# Patient Record
Sex: Male | Born: 1985
Health system: Southern US, Community
[De-identification: ages and names within clinical notes are randomized; demographics above are authoritative.]

## PROBLEM LIST (undated history)

## (undated) DIAGNOSIS — K5190 Ulcerative colitis, unspecified, without complications: Secondary | ICD-10-CM

## (undated) DIAGNOSIS — Z21 Asymptomatic human immunodeficiency virus [HIV] infection status: Secondary | ICD-10-CM

---

## 2015-12-26 ENCOUNTER — Emergency Department (HOSPITAL_BASED_OUTPATIENT_CLINIC_OR_DEPARTMENT_OTHER)
Admission: EM | Admit: 2015-12-26 | Discharge: 2015-12-26 | Disposition: A | Discharge status: Home / Self Care | Payer: Medicare Other | Attending: Emergency Medicine | Admitting: Emergency Medicine

## 2015-12-26 ENCOUNTER — Encounter (HOSPITAL_BASED_OUTPATIENT_CLINIC_OR_DEPARTMENT_OTHER): Payer: Self-pay

## 2015-12-26 DIAGNOSIS — Z202 Contact with and (suspected) exposure to infections with a predominantly sexual mode of transmission: Secondary | ICD-10-CM | POA: Diagnosis not present

## 2015-12-26 DIAGNOSIS — F17210 Nicotine dependence, cigarettes, uncomplicated: Secondary | ICD-10-CM | POA: Insufficient documentation

## 2015-12-26 DIAGNOSIS — Z21 Asymptomatic human immunodeficiency virus [HIV] infection status: Secondary | ICD-10-CM | POA: Insufficient documentation

## 2015-12-26 DIAGNOSIS — Z79899 Other long term (current) drug therapy: Secondary | ICD-10-CM | POA: Diagnosis not present

## 2015-12-26 HISTORY — DX: Ulcerative colitis, unspecified, without complications: K51.90

## 2015-12-26 HISTORY — DX: Asymptomatic human immunodeficiency virus (hiv) infection status: Z21

## 2015-12-26 MED ORDER — CEFTRIAXONE SODIUM 250 MG IJ SOLR
250.0000 mg | Freq: Once | INTRAMUSCULAR | Status: AC
  Administered 2015-12-26: 250 mg via INTRAMUSCULAR
  Filled 2015-12-26: qty 250

## 2015-12-26 MED ORDER — AZITHROMYCIN 250 MG PO TABS
1000.0000 mg | ORAL_TABLET | Freq: Once | ORAL | Status: AC
  Administered 2015-12-26: 1000 mg via ORAL
  Filled 2015-12-26: qty 4

## 2015-12-26 NOTE — ED Provider Notes (Signed)
[facility] Provider Note   CSN: [id number] Arrival date & time: 12/26/15  1147     History   Chief Complaint Chief Complaint  Patient presents with  . Exposure to STD    HPI Craig Bishop is a 30 y.o. male.  HPI   Patient is a 30 year old male with history of HIV who presents the ED with complaint of STD exposure. Patient reports she is currently sexually active with one male partner. Denies use of condoms. Patient reports he was notified by his partner yesterday that he had recently tested positive for chlamydia. Patient denies any symptoms at this time. Denies fever, oral lesions, abdominal pain, nausea, vomiting, diarrhea, rectal pain, urinary symptoms, hematuria, penile discharge, rash, penile or testicular pain/swelling. Patient reports he is followed by infectious disease regarding his HIV and his last labs he had performed in September revealed undetectable viral load. He reports having a follow-up appointment with his ID physician assessment. Pt requesting STD testing and tx.   Past Medical History:  Diagnosis Date  . HIV positive (HCC)   . Ulcerative colitis (HCC)     There are no active problems to display for this patient.   History reviewed. No pertinent surgical history.     Home Medications    Prior to Admission medications   Medication Sig Start Date End Date Taking? Authorizing Provider  Darunavir Ethanolate (PREZISTA PO) Take by mouth.   Yes Historical Provider, MD  emtricitabine-tenofovir (TRUVADA) 200-300 MG tablet Take 1 tablet by mouth daily.   Yes Historical Provider, MD  Ritonavir (NORVIR PO) Take by mouth.   Yes Historical Provider, MD    Family History No family history on file.  Social History Social History  Substance Use Topics  . Smoking status: Current Every Day Smoker    Types: Cigarettes  . Smokeless tobacco: Never Used  . Alcohol use No     Allergies   Bactrim [sulfamethoxazole-trimethoprim]; Codeine;  Hydrocodone; and Oxycodone   Review of Systems Review of Systems  All other systems reviewed and are negative.    Physical Exam Updated Vital Signs BP 129/83 (BP Location: Left Arm)   Pulse 92   Temp 98.6 F (37 C) (Oral)   Resp 20   Ht 6\' 2"  (1.88 m)   Wt 74.8 kg   SpO2 97%   BMI 21.18 kg/m   Physical Exam  Constitutional: He is oriented to person, place, and time. He appears well-developed and well-nourished.  HENT:  Head: Normocephalic and atraumatic.  Mouth/Throat: Uvula is midline, oropharynx is clear and moist and mucous membranes are normal. No oropharyngeal exudate, posterior oropharyngeal edema, posterior oropharyngeal erythema or tonsillar abscesses. No tonsillar exudate.  Eyes: Conjunctivae and EOM are normal. Right eye exhibits no discharge. Left eye exhibits no discharge. No scleral icterus.  Neck: Normal range of motion. Neck supple.  Cardiovascular: Normal rate, regular rhythm, normal heart sounds and intact distal pulses.   Pulmonary/Chest: Effort normal and breath sounds normal. No respiratory distress. He has no wheezes. He has no rales. He exhibits no tenderness.  Abdominal: Soft. Bowel sounds are normal. He exhibits no distension and no mass. There is no tenderness. There is no rebound and no guarding. Hernia confirmed negative in the right inguinal area and confirmed negative in the left inguinal area.  Genitourinary: Testes normal and penis normal. Right testis shows no mass, no swelling and no tenderness. Left testis shows no mass, no swelling and no tenderness. Circumcised. No hypospadias, penile erythema or penile  tenderness. No discharge found.  Musculoskeletal: Normal range of motion. He exhibits no edema.  Lymphadenopathy:    He has no cervical adenopathy. No inguinal adenopathy noted on the right or left side.  Neurological: He is alert and oriented to person, place, and time.  Skin: Skin is warm and dry. No rash noted.  Nursing note and vitals  reviewed.    ED Treatments / Results  Labs (all labs ordered are listed, but only abnormal results are displayed) Labs Reviewed  RPR  GC[facility]CHLAMYDIA PROBE AMP [facility]    EKG  EKG Interpretation None       Radiology No results found.  Procedures Procedures (including critical care time)  Medications Ordered in ED Medications  azithromycin (ZITHROMAX) tablet 1,000 mg (1,000 mg Oral Given 12/26/15 1407)  cefTRIAXone (ROCEPHIN) injection 250 mg (250 mg Intramuscular Given 12/26/15 1408)     Initial Impression / Assessment and Plan / ED Course  I have reviewed the triage vital signs and the nursing notes.  Pertinent labs & imaging results that were available during my care of the patient were reviewed by me and considered in my medical decision making (see chart for details).  Clinical Course     Patient is afebrile without abdominal tenderness, abdominal pain or painful bowel movements to indicate prostatitis.  No tenderness to palpation of the testes or epididymis to suggest orchitis or epididymitis.  STD cultures obtained including syphilis, gonorrhea and chlamydia. Patient to be discharged with instructions to follow up with PCP. Discussed importance of using protection when sexually active. Pt understands that they have GC/Chlamydia cultures pending and that they will need to inform all sexual partners if results return positive. Patient has been treated prophylactically with azithromycin and Rocephin.      Final Clinical Impressions(s) / ED Diagnoses   Final diagnoses:  STD exposure    New Prescriptions New Prescriptions   No medications on file     Barrett Henleicole Elizabeth Halton Neas, PA-C 12/26/15 1444    Charlynne Panderavid Hsienta Yao, MD 12/26/15 [phone]

## 2015-12-26 NOTE — ED Triage Notes (Signed)
Pt states he was notified of STD exposure-denies penile d/c and dysuria-NAD-steady gait 

## 2015-12-26 NOTE — Discharge Instructions (Signed)
1. Medications: usual home medications 2. Treatment: rest, drink plenty of fluids, use a condom with every sexual encounter 3. Follow Up: Please followup with your primary doctor in 3 days for discussion of your diagnoses and further evaluation after today's visit; if you do not have a primary care doctor use the resource guide provided to find one; Please return to the ER for worsening symptoms, high fevers or persistent vomiting. 4. Follow-up with your infectious disease doctor at your scheduled appointment next month for further management and evaluation of your HIV.   You have been tested for HIV, syphilis, chlamydia and gonorrhea.  These results will be available in approximately 3 days.  Please inform all sexual partners if you test positive for any of these diseases.

## 2015-12-27 LAB — RPR: RPR Ser Ql: NONREACTIVE

## 2015-12-30 LAB — GC/CHLAMYDIA PROBE AMP (~~LOC~~) NOT AT ARMC
Chlamydia: NEGATIVE
NEISSERIA GONORRHEA: NEGATIVE

## 2016-04-06 ENCOUNTER — Encounter (HOSPITAL_BASED_OUTPATIENT_CLINIC_OR_DEPARTMENT_OTHER): Payer: Self-pay

## 2016-04-06 ENCOUNTER — Emergency Department (HOSPITAL_BASED_OUTPATIENT_CLINIC_OR_DEPARTMENT_OTHER)
Admission: EM | Admit: 2016-04-06 | Discharge: 2016-04-06 | Disposition: A | Discharge status: Home / Self Care | Payer: Medicare Other | Attending: Emergency Medicine | Admitting: Emergency Medicine

## 2016-04-06 DIAGNOSIS — F17210 Nicotine dependence, cigarettes, uncomplicated: Secondary | ICD-10-CM | POA: Insufficient documentation

## 2016-04-06 DIAGNOSIS — Z21 Asymptomatic human immunodeficiency virus [HIV] infection status: Secondary | ICD-10-CM | POA: Insufficient documentation

## 2016-04-06 DIAGNOSIS — Z113 Encounter for screening for infections with a predominantly sexual mode of transmission: Secondary | ICD-10-CM | POA: Diagnosis present

## 2016-04-06 LAB — URINALYSIS, ROUTINE W REFLEX MICROSCOPIC
Glucose, UA: NEGATIVE mg/dL
Hgb urine dipstick: NEGATIVE
Ketones, ur: 15 mg/dL — AB
LEUKOCYTES UA: NEGATIVE
NITRITE: NEGATIVE
PROTEIN: 100 mg/dL — AB
Specific Gravity, Urine: 1.031 — ABNORMAL HIGH (ref 1.005–1.030)
pH: 6 (ref 5.0–8.0)

## 2016-04-06 LAB — URINALYSIS, MICROSCOPIC (REFLEX)

## 2016-04-06 NOTE — Discharge Instructions (Signed)
Please [facility] for [facility]D screening

## 2016-04-06 NOTE — ED Triage Notes (Signed)
Pt states he wants to get checked for STDs due to questionable exposure-denies penile d/c and dysuria-NAD-steady gait

## 2016-04-06 NOTE — ED Notes (Signed)
Pt is on the phone, tells me that he is making a report about not receiving a STD test.  Pt was screened by the EDP and was given instructions for making an appointment with the health department for STD screening.  I made sure to point out the number and the address on the discharge papers where pt can call and where he should go to have this taken care of if he decides to.  Pt tells me that he is asymptomatic but wanted a test here.  I advised him that it is our policy to give every patient a medical screening exam which he received when the EDP saw him and that we then refer certain specialty things out after this, including routine STD testing of asymptomatic patients.  He tells me that he has been here for the same thing before and had a test done, I advised him that it is not our policy and that he can get this help from the health department.  Pt is in no distress.  Left ambulatory with paperwork giving him instructions on how to reach the health department.

## 2016-04-06 NOTE — ED Notes (Signed)
Pt called ER while in room to report a complaint.

## 2016-04-06 NOTE — ED Notes (Signed)
Patient sent to the restroom to obtain urine sample 

## 2016-04-06 NOTE — ED Provider Notes (Addendum)
[facility]   CSN: [id number] Arrival date & time: 04/06/16  1208     History   Chief Complaint Chief Complaint  Patient presents with  . Exposure to STD    HPI Craig Bishop is a 31 y.o. male.  HPI Patient presents to the emergency department requesting to be checked for STDs.  He has no dysuria or penile discharge.  He is concerned that his partner may be unfaithful.  He is asymptomatic.   Past Medical History:  Diagnosis Date  . HIV positive (HCC)   . Ulcerative colitis (HCC)     There are no active problems to display for this patient.   History reviewed. No pertinent surgical history.     Home Medications    Prior to Admission medications   Medication Sig Start Date End Date Taking? Authorizing Provider  Darunavir Ethanolate (PREZISTA PO) Take by mouth.    Historical Provider, MD  emtricitabine-tenofovir (TRUVADA) 200-300 MG tablet Take 1 tablet by mouth daily.    Historical Provider, MD  Ritonavir (NORVIR PO) Take by mouth.    Historical Provider, MD    Family History No family history on file.  Social History Social History  Substance Use Topics  . Smoking status: Current Every Day Smoker    Types: Cigarettes  . Smokeless tobacco: Never Used  . Alcohol use Yes     Comment: occ     Allergies   Bactrim [sulfamethoxazole-trimethoprim]; Codeine; Hydrocodone; and Oxycodone   Review of Systems Review of Systems  Genitourinary: Negative for difficulty urinating and discharge.     Physical Exam Updated Vital Signs BP 135/85 (BP Location: Right Arm)   Pulse 93   Temp 98.2 F (36.8 C) (Oral)   Resp 18   Ht  (1.88 m)   Wt 170 lb (77.1 kg)   SpO2 100%   BMI 21.83 kg/m   Physical Exam  Constitutional: He is oriented to person, place, and time. He appears well-developed and well-nourished.  HENT:  Head: Normocephalic.  Eyes: EOM are normal.  Neck: Normal range of motion.  Pulmonary/Chest: Effort  normal.  Abdominal: He exhibits no distension.  Musculoskeletal: Normal range of motion.  Neurological: He is alert and oriented to person, place, and time.  Psychiatric: He has a normal mood and affect.  Nursing note and vitals reviewed.    ED Treatments / Results  Labs (all labs ordered are listed, but only abnormal results are displayed) Labs Reviewed  URINALYSIS, ROUTINE W REFLEX MICROSCOPIC - Abnormal; Notable for the following:       Result Value   Color, Urine AMBER (*)    Specific Gravity, Urine 1.031 (*)    Bilirubin Urine SMALL (*)    Ketones, ur 15 (*)    Protein, ur 100 (*)    All other components within normal limits  URINALYSIS, MICROSCOPIC (REFLEX) - Abnormal; Notable for the following:    Bacteria, UA FEW (*)    Squamous Epithelial / LPF 0-5 (*)    All other components within normal limits  GC/CHLAMYDIA PROBE AMP ([place]) NOT AT [facility]    EKG  EKG Interpretation None       Radiology No results found.  Procedures Procedures (including critical care time)  Medications Ordered in ED Medications - No data to display   Initial Impression / Assessment and Plan / ED Course  I have reviewed the triage vital signs and the nursing notes.  Pertinent labs & imaging results that  were available during my care of the patient were reviewed by me and considered in my medical decision making (see chart for details).     Patient requesting STD screening without any symptoms.  Patient referred to the health department.  Patient informed that the emergency department does not perform STD screening in asymptomatic patients.  No life-threatening emergency.  Medical screening examination completed.  Patient given referral information for the [facility] health department  Final Clinical Impressions(s) / ED Diagnoses   Final diagnoses:  Screen for STD (sexually transmitted disease)    New Prescriptions New Prescriptions   No medications on file     Azalia Bilis, MD 04/06/16 1312    Azalia Bilis, MD 04/23/16 1657

## 2016-04-07 LAB — GC/CHLAMYDIA PROBE AMP (~~LOC~~) NOT AT ARMC
Chlamydia: NEGATIVE
NEISSERIA GONORRHEA: NEGATIVE

## 2016-10-16 ENCOUNTER — Emergency Department (HOSPITAL_BASED_OUTPATIENT_CLINIC_OR_DEPARTMENT_OTHER)
Admission: EM | Admit: 2016-10-16 | Discharge: 2016-10-17 | Disposition: A | Discharge status: Home / Self Care | Payer: Medicare Other | Attending: Emergency Medicine | Admitting: Emergency Medicine

## 2016-10-16 ENCOUNTER — Encounter (HOSPITAL_BASED_OUTPATIENT_CLINIC_OR_DEPARTMENT_OTHER): Payer: Self-pay | Admitting: Emergency Medicine

## 2016-10-16 DIAGNOSIS — S39012A Strain of muscle, fascia and tendon of lower back, initial encounter: Secondary | ICD-10-CM | POA: Diagnosis not present

## 2016-10-16 DIAGNOSIS — Z79899 Other long term (current) drug therapy: Secondary | ICD-10-CM | POA: Diagnosis not present

## 2016-10-16 DIAGNOSIS — F17210 Nicotine dependence, cigarettes, uncomplicated: Secondary | ICD-10-CM | POA: Insufficient documentation

## 2016-10-16 DIAGNOSIS — S3992XA Unspecified injury of lower back, initial encounter: Secondary | ICD-10-CM | POA: Diagnosis present

## 2016-10-16 DIAGNOSIS — Y92410 Unspecified street and highway as the place of occurrence of the external cause: Secondary | ICD-10-CM | POA: Diagnosis not present

## 2016-10-16 DIAGNOSIS — Y999 Unspecified external cause status: Secondary | ICD-10-CM | POA: Insufficient documentation

## 2016-10-16 DIAGNOSIS — T148XXA Other injury of unspecified body region, initial encounter: Secondary | ICD-10-CM

## 2016-10-16 DIAGNOSIS — Y9389 Activity, other specified: Secondary | ICD-10-CM | POA: Insufficient documentation
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
## 2016-10-17 DIAGNOSIS — S39012A Strain of muscle, fascia and tendon of lower back, initial encounter: Secondary | ICD-10-CM | POA: Diagnosis not present

## 2016-10-17 MED ORDER — IBUPROFEN 200 MG PO TABS
ORAL_TABLET | ORAL | Status: AC
  Filled 2016-10-17: qty 1

## 2016-10-17 MED ORDER — CYCLOBENZAPRINE HCL 5 MG PO TABS
5.0000 mg | ORAL_TABLET | Freq: Once | ORAL | Status: AC
  Administered 2016-10-17: 5 mg via ORAL
  Filled 2016-10-17: qty 1

## 2016-10-17 MED ORDER — CYCLOBENZAPRINE HCL 5 MG PO TABS: 5.0000 mg | ORAL_TABLET | Freq: Two times a day (BID) | ORAL | 0 refills | Status: DC | PRN

## 2016-10-17 MED ORDER — IBUPROFEN 400 MG PO TABS
600.0000 mg | ORAL_TABLET | Freq: Once | ORAL | Status: AC
  Administered 2016-10-17: 600 mg via ORAL

## 2016-10-17 MED ORDER — NAPROXEN 500 MG PO TABS: 500.0000 mg | ORAL_TABLET | Freq: Two times a day (BID) | ORAL | 0 refills | Status: DC

## 2016-10-17 MED ORDER — IBUPROFEN 400 MG PO TABS
ORAL_TABLET | ORAL | Status: AC
  Filled 2016-10-17: qty 1
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
## 2016-10-17 NOTE — Discharge Instructions (Signed)
You will be very sore the next 24-48 hours. Take medications as directed. Do not drive or operate heavy machinery while using Flexeril.
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
## 2017-03-31 ENCOUNTER — Emergency Department (HOSPITAL_BASED_OUTPATIENT_CLINIC_OR_DEPARTMENT_OTHER): Payer: No Typology Code available for payment source

## 2017-03-31 ENCOUNTER — Emergency Department (HOSPITAL_BASED_OUTPATIENT_CLINIC_OR_DEPARTMENT_OTHER)
Admission: EM | Admit: 2017-03-31 | Discharge: 2017-03-31 | Disposition: A | Discharge status: Home / Self Care | Payer: No Typology Code available for payment source | Attending: Emergency Medicine | Admitting: Emergency Medicine

## 2017-03-31 ENCOUNTER — Encounter (HOSPITAL_BASED_OUTPATIENT_CLINIC_OR_DEPARTMENT_OTHER): Payer: Self-pay

## 2017-03-31 ENCOUNTER — Other Ambulatory Visit: Payer: Self-pay

## 2017-03-31 DIAGNOSIS — F17210 Nicotine dependence, cigarettes, uncomplicated: Secondary | ICD-10-CM | POA: Insufficient documentation

## 2017-03-31 DIAGNOSIS — M25512 Pain in left shoulder: Secondary | ICD-10-CM | POA: Diagnosis not present

## 2017-03-31 DIAGNOSIS — Z79899 Other long term (current) drug therapy: Secondary | ICD-10-CM | POA: Diagnosis not present

## 2017-03-31 DIAGNOSIS — Z21 Asymptomatic human immunodeficiency virus [HIV] infection status: Secondary | ICD-10-CM | POA: Diagnosis not present

## 2017-03-31 MED ORDER — IBUPROFEN 800 MG PO TABS: 800.0000 mg | ORAL_TABLET | Freq: Three times a day (TID) | ORAL | 0 refills | Status: DC | PRN

## 2017-03-31 MED ORDER — IBUPROFEN 800 MG PO TABS
800.0000 mg | ORAL_TABLET | Freq: Once | ORAL | Status: AC
  Administered 2017-03-31: 800 mg via ORAL
  Filled 2017-03-31: qty 1
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
## 2017-03-31 NOTE — Discharge Instructions (Signed)
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
## 2017-04-04 ENCOUNTER — Encounter: Payer: Self-pay | Admitting: Family Medicine

## 2017-04-04 ENCOUNTER — Ambulatory Visit (INDEPENDENT_AMBULATORY_CARE_PROVIDER_SITE_OTHER): Payer: Self-pay | Admitting: Family Medicine

## 2017-04-04 DIAGNOSIS — M25512 Pain in left shoulder: Secondary | ICD-10-CM

## 2017-04-04 MED ORDER — TIZANIDINE HCL 4 MG PO TABS: 4.0000 mg | ORAL_TABLET | Freq: Four times a day (QID) | ORAL | 1 refills | Status: DC | PRN

## 2017-04-04 MED ORDER — DICLOFENAC SODIUM 75 MG PO TBEC: 75.0000 mg | DELAYED_RELEASE_TABLET | Freq: Two times a day (BID) | ORAL | 1 refills | Status: DC

## 2017-04-04 NOTE — Patient Instructions (Signed)
You have strained your rotator cuff, trapezius, rhomboids. Try to avoid painful activities (overhead activities, lifting with extended arm) as much as possible. Voltaren 75mg  twice a day with food for pain and inflammation. Tizanidine as needed for spasms. Can take tylenol in addition to this. Consider physical therapy with transition to home exercise program. Do home exercise program with theraband and scapular stabilization exercises daily 3 sets of 10 once a day when tolerated. If not improving at follow-up we will consider further imaging, physical therapy. Follow up with me in 2 weeks.

## 2017-04-05 ENCOUNTER — Encounter: Payer: Self-pay | Admitting: Family Medicine

## 2017-04-05 DIAGNOSIS — M25512 Pain in left shoulder: Secondary | ICD-10-CM | POA: Insufficient documentation
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
## 2017-04-05 NOTE — Assessment & Plan Note (Signed)
independently reviewed radiographs and no evidence fracture.  Exam consistent with strain of left rotator cuff, trapezius, rhomboids.  Shown home exercises to do daily.  Voltaren twice a day with tizanidine as needed for spasms.  Consider physical therapy, further imaging if not improving.  F/u in 2 weeks.

## 2017-04-11 ENCOUNTER — Ambulatory Visit (INDEPENDENT_AMBULATORY_CARE_PROVIDER_SITE_OTHER): Payer: Self-pay | Admitting: Family Medicine

## 2017-04-11 ENCOUNTER — Telehealth: Payer: Self-pay | Admitting: Family Medicine

## 2017-04-11 ENCOUNTER — Encounter: Payer: Self-pay | Admitting: Family Medicine

## 2017-04-11 DIAGNOSIS — M25512 Pain in left shoulder: Secondary | ICD-10-CM

## 2017-04-11 NOTE — Patient Instructions (Signed)
You're doing great! Call me if you have any problems otherwise follow up as needed. 

## 2017-04-11 NOTE — Telephone Encounter (Signed)
Informed patient and put him in the schedule at 9:30 today

## 2017-04-11 NOTE — Assessment & Plan Note (Signed)
Radiographs negative.  2/2 rotator cuff strain, trapezius and rhomboid strain.  Continue home exercises most days of the week.  Voltaren, tizanidine only if needed.  F/u prn.
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
## 2017-04-11 NOTE — Telephone Encounter (Signed)
Patient requesting to be released to go back to work.  States he has been doing the home exercises and is feeling better. Still planning to follow up in a couple weeks.

## 2017-04-11 NOTE — Telephone Encounter (Signed)
Does he feel well enough to go back?  If so we could move up his follow-up to now, examine him then, and release him.  Typically I want to reexamine them to ensure it's safe for them to go back.

## 2017-04-13 ENCOUNTER — Telehealth: Payer: Self-pay | Admitting: Family Medicine

## 2017-04-13 NOTE — Telephone Encounter (Signed)
Jarold Songk, Paula please write those restrictions and stamp it for him.  Thanks!

## 2017-04-13 NOTE — Telephone Encounter (Signed)
It's not unusual to have some pain and soreness that can radiate down to the elbow.  If it's severe enough I can put him on light duty with restrictions (no lifting more than 15 pounds, no overhead motions).

## 2017-04-13 NOTE — Telephone Encounter (Signed)
Patient states he was experiencing soreness in his shoulder when he went back to work but now it has turned into pain and is radiating down to his elbow. Patient wants to know if this is normal with returning to work or if he needs to stay out of work

## 2017-04-13 NOTE — Telephone Encounter (Signed)
Spoke to patient and he would like the light duty with restrictions

## 2017-04-13 NOTE — Telephone Encounter (Signed)
Letter written and placed up front. 

## 2017-04-19 ENCOUNTER — Ambulatory Visit: Payer: Medicare Other | Admitting: Family Medicine

## 2017-07-19 ENCOUNTER — Ambulatory Visit: Payer: Medicare Other | Admitting: Family Medicine

## 2017-07-21 ENCOUNTER — Ambulatory Visit: Payer: Medicare Other | Admitting: Family Medicine

## 2017-08-09 ENCOUNTER — Ambulatory Visit: Payer: Medicare Other | Admitting: Family Medicine

## 2017-08-11 ENCOUNTER — Ambulatory Visit: Payer: Medicare Other | Admitting: Family Medicine

## 2017-09-19 ENCOUNTER — Ambulatory Visit: Payer: Medicare Other | Admitting: Family Medicine

## 2017-09-21 ENCOUNTER — Ambulatory Visit (HOSPITAL_BASED_OUTPATIENT_CLINIC_OR_DEPARTMENT_OTHER)
Admission: RE | Admit: 2017-09-21 | Discharge: 2017-09-21 | Disposition: A | Discharge status: Home / Self Care | Payer: Medicare Other | Source: Ambulatory Visit | Attending: Family Medicine | Admitting: Family Medicine

## 2017-09-21 ENCOUNTER — Encounter: Payer: Self-pay | Admitting: Family Medicine

## 2017-09-21 ENCOUNTER — Ambulatory Visit (INDEPENDENT_AMBULATORY_CARE_PROVIDER_SITE_OTHER): Payer: Self-pay | Admitting: Family Medicine

## 2017-09-21 VITALS — BP 131/80 | HR 72 | Ht 75.0 in | Wt 170.0 lb

## 2017-09-21 DIAGNOSIS — X58XXXA Exposure to other specified factors, initial encounter: Secondary | ICD-10-CM | POA: Insufficient documentation

## 2017-09-21 DIAGNOSIS — M25531 Pain in right wrist: Secondary | ICD-10-CM | POA: Insufficient documentation

## 2017-09-21 DIAGNOSIS — M5441 Lumbago with sciatica, right side: Secondary | ICD-10-CM

## 2017-09-21 DIAGNOSIS — S6991XA Unspecified injury of right wrist, hand and finger(s), initial encounter: Secondary | ICD-10-CM | POA: Diagnosis present

## 2017-09-21 DIAGNOSIS — M25551 Pain in right hip: Secondary | ICD-10-CM

## 2017-09-21 DIAGNOSIS — M25552 Pain in left hip: Secondary | ICD-10-CM

## 2017-09-21 DIAGNOSIS — M5442 Lumbago with sciatica, left side: Secondary | ICD-10-CM

## 2017-09-21 MED ORDER — TIZANIDINE HCL 4 MG PO TABS
4.0000 mg | ORAL_TABLET | Freq: Three times a day (TID) | ORAL | 1 refills | Status: DC | PRN
Start: 2017-09-21 — End: 2023-02-12

## 2017-09-21 MED ORDER — METHOCARBAMOL 500 MG PO TABS: 500.0000 mg | ORAL_TABLET | Freq: Three times a day (TID) | ORAL | 1 refills | Status: DC | PRN

## 2017-09-21 MED ORDER — DICLOFENAC SODIUM 75 MG PO TBEC: 75.0000 mg | DELAYED_RELEASE_TABLET | Freq: Two times a day (BID) | ORAL | 1 refills | Status: DC

## 2017-09-21 MED ORDER — PREDNISONE 10 MG PO TABS: ORAL_TABLET | ORAL | 0 refills | Status: DC

## 2017-09-21 NOTE — Patient Instructions (Addendum)
You have a wrist sprain. Your x-rays are reassuring. Wear wrist brace as often as possible including when sleeping except to ice the area, wash it. Occupational therapy is a consideration.  You have bilateral hip contusions but I'm also worried you may have a pinched nerve in your back (lumbar radiculopathy). Take tylenol for baseline pain relief (1-2 extra strength tabs 3x/day) A prednisone dose pack is the best option for immediate relief and may be prescribed. Day after finishing prednisone start diclofenac twice a day with food for pain and inflammation. Robaxin as needed for muscle spasms (no driving on this medicine if it makes you sleepy). Stay as active as possible. Physical therapy has been shown to be helpful as well - start this. Strengthening of low back muscles, abdominal musculature are key for long term pain relief. If not improving, will consider further imaging (MRI). Follow up with me in 1 month.

## 2017-09-25 ENCOUNTER — Encounter: Payer: Self-pay | Admitting: Family Medicine
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
## 2017-10-04 ENCOUNTER — Ambulatory Visit: Payer: Medicare Other | Attending: Family Medicine | Admitting: Physical Therapy

## 2017-10-04 ENCOUNTER — Other Ambulatory Visit: Payer: Self-pay

## 2017-10-04 DIAGNOSIS — M25551 Pain in right hip: Secondary | ICD-10-CM | POA: Diagnosis present

## 2017-10-04 DIAGNOSIS — M25552 Pain in left hip: Secondary | ICD-10-CM | POA: Insufficient documentation

## 2017-10-04 DIAGNOSIS — M62838 Other muscle spasm: Secondary | ICD-10-CM

## 2017-10-04 DIAGNOSIS — M5441 Lumbago with sciatica, right side: Secondary | ICD-10-CM | POA: Insufficient documentation
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
## 2017-10-12 ENCOUNTER — Ambulatory Visit: Payer: Medicare Other | Admitting: Physical Therapy

## 2017-10-12 DIAGNOSIS — M62838 Other muscle spasm: Secondary | ICD-10-CM

## 2017-10-12 DIAGNOSIS — M5441 Lumbago with sciatica, right side: Secondary | ICD-10-CM

## 2017-10-12 DIAGNOSIS — M25551 Pain in right hip: Secondary | ICD-10-CM

## 2017-10-12 DIAGNOSIS — M25552 Pain in left hip: Secondary | ICD-10-CM

## 2017-10-12 NOTE — Therapy (Signed)
[facility] Outpatient Rehabilitation [facility] [address]  [address] [place], [place], [address] Phone: [phone]   Fax:  [phone]  Physical Therapy Treatment  Patient Details  Name: Craig Bishop MRN: [id number] Date of Birth: 04-19-1985 Referring Provider (PT): Tawni Millers, MD   Encounter Date: 10/12/2017  PT End of Session - 10/12/17 1926    Visit Number  2    Number of Visits  12    Date for PT Re-Evaluation  11/15/17    Authorization Type  Medicare & Medicaid    PT Start Time  1705   pt late   PT Stop Time  1800    PT Time Calculation (min)  55 min    Activity Tolerance  Patient tolerated treatment well    Behavior During Therapy  [facility] for tasks assessed/performed       Past Medical History:  Diagnosis Date  . HIV positive (HCC)   . Ulcerative colitis (HCC)     No past surgical history on file.  There were no vitals filed for this visit.  Subjective Assessment - 10/12/17 1749    Subjective  Pt relays pain, tightness, and soreness in his Rt hip and knees    Currently in Pain?  Yes    Pain Score  7     Pain Location  Hip   and knees   Pain Orientation  Right    Pain Descriptors / Indicators  Aching;Sore                       [facility] Adult PT Treatment/Exercise - 10/12/17 0001      Exercises   Exercises  Lumbar;Knee/Hip      Lumbar Exercises: Stretches   Single Knee to Chest Stretch  2 reps;30 seconds;Right;Left    Lower Trunk Rotation  5 reps;10 seconds    Piriformis Stretch  Right;Left;2 reps;30 seconds      Knee/Hip Exercises: Stretches   Active Hamstring Stretch  Right;Left;2 reps;30 seconds      Knee/Hip Exercises: Aerobic   Nustep  5 min L3      Knee/Hip Exercises: Supine   Other Supine Knee/Hip Exercises  clam red 2X10      Modalities   Modalities  Electrical Stimulation;Moist Heat      Moist Heat Therapy   Number Minutes Moist Heat  15 Minutes    Moist Heat Location  Hip   pt prone     Electrical Stimulation   Electrical Stimulation Location  lumbar/Rt hip    Electrical Stimulation Action  IFC     Electrical Stimulation Parameters  tolerance 80-150 mhz    Electrical Stimulation Goals  Pain               PT Short Term Goals - 10/04/17 1700      PT SHORT TERM GOAL #1   Title  Independent with initial HEP    Status  New    Target Date  10/18/17        PT Long Term Goals - 10/04/17 1700      PT LONG TERM GOAL #1   Title  Independent with ongoing/advanced HEP    Status  New    Target Date  11/15/17      PT LONG TERM GOAL #2   Title  Pt will report reduction in hip and low back pain by >/= 50%    Status  New    Target Date  11/15/17  PT LONG TERM GOAL #3   Title  Pt will demonstrate low back and hip ROM WFL w/o restriction to due pain    Status  New    Target Date  11/15/17      PT LONG TERM GOAL #4   Title  Pt to report ability to perform work and daily activities without pain provocation    Status  New    Target Date  11/15/17      PT LONG TERM GOAL #5   Title  Pt to demonstrate appropriate posture and body mechanics needed for daily activities    Status  New    Target Date  11/15/17            Plan - 10/12/17 1926    Clinical Impression Statement  Pt was treated with threx for lumbar and hip stretching and gentle strengthening to tolerance. He has decreased activity tolerance and muscle guarding so session focused more on stretching. He recieved MHP with TENS post tx to decrease pain and inflammation and he reports positive return from this.     Rehab Potential  Good    PT Frequency  2x / week    PT Duration  6 weeks    PT Treatment/Interventions  Patient/family education;Neuromuscular re-education;Therapeutic exercise;Therapeutic activities;Functional mobility training;ADLs/Self Care Home Management;Moist Heat;Electrical Stimulation;Cryotherapy;Ultrasound;Traction;Iontophoresis 4mg /ml Dexamethasone;Manual techniques;Taping;Dry  needling;Passive range of motion    Consulted and Agree with Plan of Care  Patient       Patient will benefit from skilled therapeutic intervention in order to improve the following deficits and impairments:  Pain, Increased muscle spasms, Impaired flexibility, Hypomobility, Decreased range of motion, Decreased strength, Decreased activity tolerance, Postural dysfunction, Improper body mechanics, Difficulty walking, Decreased mobility  Visit Diagnosis: Pain in right hip  Acute bilateral low back pain with right-sided sciatica  Pain in left hip  Other muscle spasm     Problem List Patient Active Problem List   Diagnosis Date Noted  . Left shoulder pain 04/05/2017    April Manson, PT, DPT 10/12/2017, 7:29 PM  [facility] [address]  [address] [place], [place], [address] Phone: [phone]   Fax:  [phone]  Name: Craig Bishop MRN: [id number] Date of Birth: March 25, 1985

## 2017-10-17 ENCOUNTER — Encounter: Payer: Medicare Other | Admitting: Physical Therapy

## 2017-10-21 ENCOUNTER — Ambulatory Visit: Payer: Medicare Other | Admitting: Family Medicine

## 2017-10-24 ENCOUNTER — Encounter: Payer: Self-pay | Admitting: Family Medicine

## 2017-10-24 ENCOUNTER — Encounter: Payer: Self-pay | Admitting: Physical Therapy

## 2017-10-24 ENCOUNTER — Ambulatory Visit: Payer: Medicare Other | Admitting: Physical Therapy

## 2017-10-24 ENCOUNTER — Ambulatory Visit (INDEPENDENT_AMBULATORY_CARE_PROVIDER_SITE_OTHER): Payer: Medicare Other | Admitting: Family Medicine

## 2017-10-24 VITALS — BP 131/82 | HR 86 | Ht 75.0 in | Wt 170.0 lb

## 2017-10-24 DIAGNOSIS — M25552 Pain in left hip: Secondary | ICD-10-CM | POA: Diagnosis not present

## 2017-10-24 DIAGNOSIS — M25551 Pain in right hip: Secondary | ICD-10-CM

## 2017-10-24 DIAGNOSIS — M5441 Lumbago with sciatica, right side: Secondary | ICD-10-CM

## 2017-10-24 DIAGNOSIS — S6991XD Unspecified injury of right wrist, hand and finger(s), subsequent encounter: Secondary | ICD-10-CM

## 2017-10-24 DIAGNOSIS — M62838 Other muscle spasm: Secondary | ICD-10-CM

## 2017-10-24 NOTE — Therapy (Signed)
[facility] Outpatient Rehabilitation [facility] [address]  [address] [place], [place], [address] Phone: [phone]   Fax:  [phone]  Physical Therapy Treatment  Patient Details  Name: Craig Bishop MRN: [id number] Date of Birth: [birth year] Referring Provider (PT): Tawni Millers, MD   Encounter Date: 10/24/2017  PT End of Session - 10/24/17 1619    Visit Number  3    Number of Visits  12    Date for PT Re-Evaluation  11/15/17    Authorization Type  Medicare & Medicaid    PT Start Time  1619    PT Stop Time  1720    PT Time Calculation (min)  61 min    Activity Tolerance  Patient tolerated treatment well    Behavior During Therapy  [facility] for tasks assessed/performed       Past Medical History:  Diagnosis Date  . HIV positive (HCC)   . Ulcerative colitis (HCC)     History reviewed. No pertinent surgical history.  There were no vitals filed for this visit.  Subjective Assessment - 10/24/17 1624    Subjective  Pt reports he saw MD today - will be scheduled for MRI on Sat to r/o pinched nerve.    Pertinent History  HIV positive    Patient Stated Goals  "to be pain free if possible or find ways to cope with the pain"    Currently in Pain?  Yes    Pain Score  7    6-7/10   Pain Location  Hip   & knee   Pain Orientation  Right    Pain Descriptors / Indicators  Dull    Pain Type  Acute pain    Pain Onset  More than a month ago    Pain Frequency  Constant   varies in intensity   Multiple Pain Sites  Yes    Pain Score  7   7.5/10   Pain Location  Back    Pain Orientation  Lower;Right;Left    Pain Descriptors / Indicators  Cramping;Sore    Pain Type  Acute pain    Pain Onset  More than a month ago    Pain Frequency  Constant                       [facility] Adult PT Treatment/Exercise - 10/24/17 1619      Exercises   Exercises  Lumbar;Knee/Hip      Lumbar Exercises: Stretches   Single Knee to Chest Stretch  Right;Left;30  seconds;2 reps    Double Knee to Chest Stretch  20 seconds;1 rep    Double Knee to Chest Stretch Limitations  pt preferring SKTC    Lower Trunk Rotation  5 reps;10 seconds    Piriformis Stretch  Right;Left;30 seconds;2 reps    Piriformis Stretch Limitations  supine KTOS      Lumbar Exercises: Supine   Clam  10 reps;5 seconds    Clam Limitations  red TB    Bridge  5 reps;5 seconds   2 sets   Bridge Limitations  2nd set + hip ABD isometric with red TB      Knee/Hip Exercises: Stretches   Passive Hamstring Stretch  Right;Left;30 seconds;2 reps    Passive Hamstring Stretch Limitations  supine with strap    Quad Stretch  Right;Left;30 seconds;2 reps    Quad Stretch Limitations  prone with strap, 2nd rep with pillow under lower thigh to increase hip  extension      Knee/Hip Exercises: Aerobic   Recumbent Bike  L2 x 6 min      Modalities   Modalities  Electrical Stimulation;Moist Heat      Moist Heat Therapy   Number Minutes Moist Heat  15 Minutes    Moist Heat Location  Hip;Lumbar Spine   pt prone     Electrical Stimulation   Electrical Stimulation Location  lumbar/Rt hip    Electrical Stimulation Action  IFC    Electrical Stimulation Parameters  80-150 Hz, intensity to pt tol x 15'    Electrical Stimulation Goals  Pain;Tone      Manual Therapy   Manual Therapy  Soft tissue mobilization;Myofascial release    Manual therapy comments  prone    Soft tissue mobilization  B lumbar paraspinals & R glutes/pirformis    Myofascial Release  manual TPR to R glute minimus & piriformis             PT Education - 10/24/17 1700    Education Details  Initial HEP    Person(s) Educated  Patient    Methods  Explanation;Demonstration;Handout   pt left w/o handout   Comprehension  Verbalized understanding;Returned demonstration;Need further instruction       PT Short Term Goals - 10/24/17 1815      PT SHORT TERM GOAL #1   Title  Independent with initial HEP    Status  On-going         PT Long Term Goals - 10/24/17 1630      PT LONG TERM GOAL #1   Title  Independent with ongoing/advanced HEP    Status  On-going      PT LONG TERM GOAL #2   Title  Pt will report reduction in hip and low back pain by >/= 50%    Status  On-going      PT LONG TERM GOAL #3   Title  Pt will demonstrate low back and hip ROM WFL w/o restriction to due pain    Status  On-going      PT LONG TERM GOAL #4   Title  Pt to report ability to perform work and daily activities without pain provocation    Status  On-going      PT LONG TERM GOAL #5   Title  Pt to demonstrate appropriate posture and body mechanics needed for daily activities    Status  On-going            Plan - 10/24/17 1700    Clinical Impression Statement  Tytus reports pain remains unchanged at this point and cannot recall if any of the stretches/exercises from last visit seemed to be helpful. Reviewed stretches and introduced basic lumbar/hip strengthening exrecises - created HEP based on stretches and exercises where pt noted most benefit and/or did not increase his pain, however pt left w/o handout. Treatment concluded with estim & MHP as pt noting benefit from this last session.    Rehab Potential  Good    PT Frequency  2x / week    PT Duration  6 weeks    PT Treatment/Interventions  Patient/family education;Neuromuscular re-education;Therapeutic exercise;Therapeutic activities;Functional mobility training;ADLs/Self Care Home Management;Moist Heat;Electrical Stimulation;Cryotherapy;Ultrasound;Traction;Iontophoresis 4mg /ml Dexamethasone;Manual techniques;Taping;Dry needling;Passive range of motion    PT Next Visit Plan  Issue HEP handout    Consulted and Agree with Plan of Care  Patient       Patient will benefit from skilled therapeutic intervention in order to improve the following  deficits and impairments:  Pain, Increased muscle spasms, Impaired flexibility, Hypomobility, Decreased range of motion, Decreased  strength, Decreased activity tolerance, Postural dysfunction, Improper body mechanics, Difficulty walking, Decreased mobility  Visit Diagnosis: Pain in right hip  Acute bilateral low back pain with right-sided sciatica  Pain in left hip  Other muscle spasm     Problem List Patient Active Problem List   Diagnosis Date Noted  . Left shoulder pain 04/05/2017    Marry Guan, PT, MPT 10/24/2017, 6:24 PM  [facility] [address]  [address] [place], [place], [address] Phone: [phone]   Fax:  [phone]  Name: Demarea Lorey MRN: [id number] Date of Birth: 28-Dec-1985

## 2017-10-24 NOTE — Patient Instructions (Signed)
We will go ahead with an MRI of your lumbar spine. Continue the diclofenac with robaxin as needed. Continue physical therapy and home exercises. Next steps will depend on the MRI results.

## 2017-10-25 ENCOUNTER — Encounter: Payer: Self-pay | Admitting: Family Medicine
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
## 2017-10-26 ENCOUNTER — Ambulatory Visit: Payer: Medicare Other | Admitting: Physical Therapy

## 2017-10-26 ENCOUNTER — Encounter: Payer: Medicare Other | Admitting: Physical Therapy

## 2017-10-26 DIAGNOSIS — M25552 Pain in left hip: Secondary | ICD-10-CM

## 2017-10-26 DIAGNOSIS — M25551 Pain in right hip: Secondary | ICD-10-CM | POA: Diagnosis not present

## 2017-10-26 DIAGNOSIS — M5441 Lumbago with sciatica, right side: Secondary | ICD-10-CM

## 2017-10-26 DIAGNOSIS — M62838 Other muscle spasm: Secondary | ICD-10-CM

## 2017-10-26 NOTE — Therapy (Signed)
[facility] Outpatient Rehabilitation [facility] [address]  [address] [place], [place], [address] Phone: [phone]   Fax:  [phone]  Physical Therapy Treatment  Patient Details  Name: Craig Bishop MRN: [id number] Date of Birth: 01/24/1985 Referring Provider (PT): Tawni Millers, MD   Encounter Date: 10/26/2017  PT End of Session - 10/26/17 1750    Visit Number  4    Number of Visits  12    Date for PT Re-Evaluation  11/15/17    Authorization Type  Medicare & Medicaid    PT Start Time  1720   pt 20 min late   PT Stop Time  1750    PT Time Calculation (min)  30 min    Activity Tolerance  Patient tolerated treatment well    Behavior During Therapy  [facility] for tasks assessed/performed       Past Medical History:  Diagnosis Date  . HIV positive (HCC)   . Ulcerative colitis (HCC)     No past surgical history on file.  There were no vitals filed for this visit.  Subjective Assessment - 10/26/17 1742    Subjective  Pt reports his Rt low back and hip are bothering him, he was 20 min late for appointment today    Currently in Pain?  Yes    Pain Score  6     Pain Location  Hip    Pain Orientation  Right    Pain Descriptors / Indicators  Aching                       [facility] Adult PT Treatment/Exercise - 10/26/17 1743      Exercises   Exercises  Lumbar;Knee/Hip      Lumbar Exercises: Stretches   Active Hamstring Stretch  Right;Left;2 reps;30 seconds    Single Knee to Chest Stretch  Right;Left;30 seconds;2 reps    Double Knee to Chest Stretch  20 seconds;2 reps    Double Knee to Chest Stretch Limitations  pt preferring SKTC    Lower Trunk Rotation  --    Quad Stretch  Right;Left;2 reps;30 seconds    Quad Stretch Limitations  prone with strap    Piriformis Stretch  --    Piriformis Stretch Limitations  --      Lumbar Exercises: Supine   Clam  5 seconds;15 reps    Clam Limitations  red TB    Bridge  5 seconds;10 reps   2 sets    Bridge Limitations  +hip ABD isometric with red TB      Knee/Hip Exercises: Community education officer  --    Passive Hamstring Stretch Limitations  --    Barista Limitations  --      Knee/Hip Exercises: Aerobic   Recumbent Bike  L2 x 3 min   pt late     Modalities   Modalities  Electrical Stimulation;Moist Heat      Moist Heat Therapy   Number Minutes Moist Heat  12 Minutes    Moist Heat Location  Hip;Lumbar Spine   pt prone     Electrical Stimulation   Electrical Stimulation Location  lumbar/Rt hip    Electrical Stimulation Goals  Pain;Tone      Manual Therapy   Manual Therapy  Soft tissue mobilization;Myofascial release    Manual therapy comments  prone    Soft tissue mobilization  B lumbar paraspinals & R  glutes/pirformis    Myofascial Release  manual TPR to R glute minimus & piriformis               PT Short Term Goals - 10/24/17 1815      PT SHORT TERM GOAL #1   Title  Independent with initial HEP    Status  On-going        PT Long Term Goals - 10/24/17 1630      PT LONG TERM GOAL #1   Title  Independent with ongoing/advanced HEP    Status  On-going      PT LONG TERM GOAL #2   Title  Pt will report reduction in hip and low back pain by >/= 50%    Status  On-going      PT LONG TERM GOAL #3   Title  Pt will demonstrate low back and hip ROM WFL w/o restriction to due pain    Status  On-going      PT LONG TERM GOAL #4   Title  Pt to report ability to perform work and daily activities without pain provocation    Status  On-going      PT LONG TERM GOAL #5   Title  Pt to demonstrate appropriate posture and body mechanics needed for daily activities    Status  On-going            Plan - 10/26/17 1745    Clinical Impression Statement  Pt issued his illustrated copy of HEP that he left last time. This was reviewed with him showing good return demonstration. Session ended with MHP and TENS to derease  pain. Session cut short today as he was 20 min late, he will have MRI on sat.     Rehab Potential  Good    PT Frequency  2x / week    PT Duration  6 weeks    PT Treatment/Interventions  Patient/family education;Neuromuscular re-education;Therapeutic exercise;Therapeutic activities;Functional mobility training;ADLs/Self Care Home Management;Moist Heat;Electrical Stimulation;Cryotherapy;Ultrasound;Traction;Iontophoresis 4mg /ml Dexamethasone;Manual techniques;Taping;Dry needling;Passive range of motion    PT Next Visit Plan  MRI results?    Consulted and Agree with Plan of Care  Patient       Patient will benefit from skilled therapeutic intervention in order to improve the following deficits and impairments:  Pain, Increased muscle spasms, Impaired flexibility, Hypomobility, Decreased range of motion, Decreased strength, Decreased activity tolerance, Postural dysfunction, Improper body mechanics, Difficulty walking, Decreased mobility  Visit Diagnosis: Pain in right hip  Acute bilateral low back pain with right-sided sciatica  Pain in left hip  Other muscle spasm     Problem List Patient Active Problem List   Diagnosis Date Noted  . Left shoulder pain 04/05/2017    April Manson, PT, DPT 10/26/2017, 5:51 PM  [facility] [address]  [address] [place], [place], [address] Phone: [phone]   Fax:  [phone]  Name: Craig Bishop MRN: [id number] Date of Birth: [birth year]

## 2017-10-29 ENCOUNTER — Ambulatory Visit (HOSPITAL_BASED_OUTPATIENT_CLINIC_OR_DEPARTMENT_OTHER)
Admission: RE | Admit: 2017-10-29 | Discharge: 2017-10-29 | Disposition: A | Discharge status: Home / Self Care | Payer: No Typology Code available for payment source | Source: Ambulatory Visit | Attending: Family Medicine | Admitting: Family Medicine

## 2017-10-29 DIAGNOSIS — M25552 Pain in left hip: Secondary | ICD-10-CM | POA: Diagnosis not present

## 2017-10-29 DIAGNOSIS — M25551 Pain in right hip: Secondary | ICD-10-CM | POA: Insufficient documentation

## 2017-10-31 ENCOUNTER — Ambulatory Visit: Payer: Medicare Other | Admitting: Physical Therapy

## 2017-10-31 ENCOUNTER — Encounter: Payer: Self-pay | Admitting: Physical Therapy

## 2017-10-31 DIAGNOSIS — M62838 Other muscle spasm: Secondary | ICD-10-CM

## 2017-10-31 DIAGNOSIS — M5441 Lumbago with sciatica, right side: Secondary | ICD-10-CM

## 2017-10-31 DIAGNOSIS — M25552 Pain in left hip: Secondary | ICD-10-CM

## 2017-10-31 DIAGNOSIS — M25551 Pain in right hip: Secondary | ICD-10-CM | POA: Diagnosis not present

## 2017-10-31 NOTE — Patient Instructions (Signed)
TENS UNIT  This is helpful for muscle pain and spasm.   Search and Purchase a TENS 7000 2nd edition at www.tenspros.com or www.amazon.com  (It should be less than $30)     TENS unit instructions:   Do not shower or bathe with the unit on  Turn the unit off before removing electrodes or batteries  If the electrodes lose stickiness add a drop of water to the electrodes after they are disconnected from the unit and place on plastic sheet. If you continued to have difficulty, call the TENS unit company to purchase more electrodes.  Do not apply lotion on the skin area prior to use. Make sure the skin is clean and dry as this will help prolong the life of the electrodes.  After use, always check skin for unusual red areas, rash or other skin difficulties. If there are any skin problems, does not apply electrodes to the same area.  Never remove the electrodes from the unit by pulling the wires.  Do not use the TENS unit or electrodes other than as directed.  Do not change electrode placement without consulting your therapist or physician.  Keep 2 fingers with between each electrode.   TENS stands for Transcutaneous Electrical Nerve Stimulation. In other words, electrical impulses are allowed to pass through the skin in order to excite a nerve.   Purpose and Use of TENS:  TENS is a method used to manage acute and chronic pain without the use of drugs. It has been effective in managing pain associated with surgery, sprains, strains, trauma, rheumatoid arthritis, and neuralgias. It is a non-addictive, low risk, and non-invasive technique used to control pain. It is not, by any means, a curative form of treatment.   How TENS Works:  Most TENS units are a small pocket-sized unit powered by one 9 volt battery. Attached to the outside of the unit are two lead wires where two pins and/or snaps connect on each wire. All units come with a set of four reusable pads or electrodes. These are placed  on the skin surrounding the area involved. By inserting the leads into  the pads, the electricity can pass from the unit making the circuit complete.  As the intensity is turned up slowly, the electrical current enters the body from the electrodes through the skin to the surrounding nerve fibers. This triggers the release of hormones from within the body. These hormones contain pain relievers. By increasing the circulation of these hormones, the person's pain may be lessened. It is also believed that the electrical stimulation itself helps to block the pain messages being sent to the brain, thus also decreasing the body's perception of pain.   Hazards:  TENS units are NOT to be used by patients with PACEMAKERS, DEFIBRILLATORS, DIABETIC PUMPS, PREGNANT WOMEN, and patients with SEIZURE DISORDERS.  TENS units are NOT to be used over the heart, throat, brain, or spinal cord.  One of the major side effects from the TENS unit may be skin irritation. Some people may develop a rash if they are sensitive to the materials used in the electrodes or the connecting wires.   Wear the unit for up to 15-30 minutes at a time.   Avoid overuse due the body getting used to the stem making it not as effective over time.      
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
## 2017-11-02 ENCOUNTER — Ambulatory Visit: Payer: Medicare Other

## 2017-11-02 DIAGNOSIS — M5441 Lumbago with sciatica, right side: Secondary | ICD-10-CM

## 2017-11-02 DIAGNOSIS — M25551 Pain in right hip: Secondary | ICD-10-CM | POA: Diagnosis not present

## 2017-11-02 DIAGNOSIS — M25552 Pain in left hip: Secondary | ICD-10-CM

## 2017-11-02 DIAGNOSIS — M62838 Other muscle spasm: Secondary | ICD-10-CM

## 2017-11-02 NOTE — Therapy (Signed)
[facility] [address]  [address] [place], [place], [address] Phone: [phone]   Fax:  [phone]  Physical Therapy Treatment  Patient Details  Name: Craig Bishop MRN: [id number] Date of Birth: [birth year] Referring Provider (PT): Norton Blizzard, MD   Encounter Date: 11/02/2017  PT End of Session - 11/02/17 1708    Visit Number  6    Number of Visits  12    Date for PT Re-Evaluation  11/15/17    Authorization Type  Medicare & Medicaid    PT Start Time  1701    PT Stop Time  1755    PT Time Calculation (min)  54 min    Activity Tolerance  Patient tolerated treatment well    Behavior During Therapy  [facility] for tasks assessed/performed       Past Medical History:  Diagnosis Date  . HIV positive (HCC)   . Ulcerative colitis (HCC)     No past surgical history on file.  There were no vitals filed for this visit.  Subjective Assessment - 11/02/17 1706    Subjective  Pt. noting some of the HEP activities how helped manage back pain.      Pertinent History  HIV positive    Limitations  House hold activities;Lifting    How long can you sit comfortably?  unlimited     How long can you stand comfortably?  1 hour     How long can you walk comfortably?  30 min     Diagnostic tests  10/29/17 - Lumbar MRI: Normal lumbar spine MRI.  07/26/17 - B hip/pelvis x-rays: No acute fracture or malalignment.     Patient Stated Goals  "to be pain free if possible or find ways to cope with the pain"    Currently in Pain?  Yes    Pain Score  4     Pain Location  Hip    Pain Orientation  Left;Lateral    Pain Descriptors / Indicators  Dull    Pain Type  Acute pain    Pain Radiating Towards  numbness/tingling down R LE to foot     Pain Onset  More than a month ago    Pain Frequency  Constant    Multiple Pain Sites  Yes    Pain Score  4    Pain Location  Back    Pain Orientation  Lower;Right;Left    Pain Descriptors / Indicators   Aching;Throbbing    Pain Type  Acute pain    Pain Onset  More than a month ago    Pain Frequency  Constant    Aggravating Factors   stairs    Pain Relieving Factors  rest                        [facility] Adult PT Treatment/Exercise - 11/02/17 1714      Lumbar Exercises: Stretches   Lower Trunk Rotation  5 reps;10 seconds    Piriformis Stretch  Right;1 rep;30 seconds    Piriformis Stretch Limitations  supine KTOS      Lumbar Exercises: Supine   Bridge  5 seconds   x 12 reps    Bridge Limitations  +hip ABD isometric with green TB      Lumbar Exercises: Sidelying   Clam  Right;Left;3 seconds   x 12    Clam Limitations  red TB      Lumbar Exercises: Prone  Other Prone Lumbar Exercises  prone press up 3" x 10 reps - partial       Lumbar Exercises: Quadruped   Opposite Arm/Leg Raise  Right arm/Left leg;Left arm/Right leg;3 seconds   x 12 reps      Knee/Hip Exercises: Aerobic   Recumbent Bike  L2 x 7 min      Moist Heat Therapy   Number Minutes Moist Heat  10 Minutes    Moist Heat Location  Hip;Lumbar Spine      Electrical Stimulation   Electrical Stimulation Location  lumbar/Rt hip    Electrical Stimulation Action  IFC    Electrical Stimulation Parameters  to tolerance, 10'    Electrical Stimulation Goals  Pain;Tone               PT Short Term Goals - 10/31/17 1632      PT SHORT TERM GOAL #1   Title  Independent with initial HEP    Status  Achieved        PT Long Term Goals - 10/24/17 1630      PT LONG TERM GOAL #1   Title  Independent with ongoing/advanced HEP    Status  On-going      PT LONG TERM GOAL #2   Title  Pt will report reduction in hip and low back pain by >/= 50%    Status  On-going      PT LONG TERM GOAL #3   Title  Pt will demonstrate low back and hip ROM WFL w/o restriction to due pain    Status  On-going      PT LONG TERM GOAL #4   Title  Pt to report ability to perform work and daily activities without pain  provocation    Status  On-going      PT LONG TERM GOAL #5   Title  Pt to demonstrate appropriate posture and body mechanics needed for daily activities    Status  On-going            Plan - 11/02/17 1711    Clinical Impression Statement  Pt. noting ~ 50% improvement in overall pain levels since starting therapy.  Notes no further questions following receiving TENS unit handout and still plans to purchase unit for home use.  Tolerated all proximal hip strengthening and LE stretching/ROM activities in session well today.  Notes he is getting relief from LBP with HEP now.  Ended visit with E-stim/moist heat to lumbar spine to reduce pain.  Will continue to progress.      PT Treatment/Interventions  Patient/family education;Neuromuscular re-education;Therapeutic exercise;Therapeutic activities;Functional mobility training;ADLs/Self Care Home Management;Moist Heat;Electrical Stimulation;Cryotherapy;Ultrasound;Traction;Iontophoresis 4mg /ml Dexamethasone;Manual techniques;Taping;Dry needling;Passive range of motion    Consulted and Agree with Plan of Care  Patient       Patient will benefit from skilled therapeutic intervention in order to improve the following deficits and impairments:  Pain, Increased muscle spasms, Impaired flexibility, Hypomobility, Decreased range of motion, Decreased strength, Decreased activity tolerance, Postural dysfunction, Improper body mechanics, Difficulty walking, Decreased mobility  Visit Diagnosis: Pain in right hip  Acute bilateral low back pain with right-sided sciatica  Pain in left hip  Other muscle spasm     Problem List Patient Active Problem List   Diagnosis Date Noted  . Left shoulder pain 04/05/2017    Kermit Balo, PTA 11/02/17 6:08 PM   [facility] [address]  [address] [place], [place], [address] Phone: [phone]  Fax:  [phone]  Name: Craig Bishop MRN:  [id number] Date of Birth: 05/24/1985

## 2017-11-07 ENCOUNTER — Encounter: Payer: Self-pay | Admitting: Physical Therapy

## 2017-11-07 ENCOUNTER — Ambulatory Visit: Payer: Medicare Other | Attending: Family Medicine | Admitting: Physical Therapy

## 2017-11-07 DIAGNOSIS — M25551 Pain in right hip: Secondary | ICD-10-CM | POA: Diagnosis present

## 2017-11-07 DIAGNOSIS — M5441 Lumbago with sciatica, right side: Secondary | ICD-10-CM | POA: Diagnosis present

## 2017-11-07 DIAGNOSIS — M25552 Pain in left hip: Secondary | ICD-10-CM | POA: Diagnosis present

## 2017-11-07 DIAGNOSIS — M62838 Other muscle spasm: Secondary | ICD-10-CM | POA: Diagnosis present

## 2017-11-07 NOTE — Therapy (Signed)
[facility]patient Rehabilitation [facility] [address]  [address] [place], [place], [address] Phone: [phone]   Fax:  [phone]  Physical Therapy Treatment  Patient Details  Name: Craig Bishop MRN: [id number] Date of Birth: 11/14/1985 Referring Provider (PT): Norton Blizzard, MD   Encounter Date: 11/07/2017  PT End of Session - 11/07/17 1701    Visit Number  7    Number of Visits  12    Date for PT Re-Evaluation  11/15/17    Authorization Type  Medicare & Medicaid    PT Start Time  1701    PT Stop Time  1802    PT Time Calculation (min)  61 min    Activity Tolerance  Patient tolerated treatment well    Behavior During Therapy  [facility] for tasks assessed/performed       Past Medical History:  Diagnosis Date  . HIV positive (HCC)   . Ulcerative colitis (HCC)     History reviewed. No pertinent surgical history.  There were no vitals filed for this visit.  Subjective Assessment - 11/07/17 1704    Subjective  Pt reporting no hip pain today, only central low back pain. Also stating no radicular pain since last visit.    Pertinent History  HIV positive    Diagnostic tests  10/29/17 - Lumbar MRI: Normal lumbar spine MRI.  07/26/17 - B hip/pelvis x-rays: No acute fracture or malalignment.     Patient Stated Goals  "to be pain free if possible or find ways to cope with the pain"    Currently in Pain?  Yes    Pain Score  4     Pain Location  Back    Pain Orientation  Lower    Pain Descriptors / Indicators  Throbbing;Dull    Pain Type  Acute pain    Multiple Pain Sites  No                       [facility] Adult PT Treatment/Exercise - 11/07/17 1701      Exercises   Exercises  Lumbar;Knee/Hip      Lumbar Exercises: Stretches   Quadruped Mid Back Stretch  30 seconds;1 rep   each   Quadruped Mid Back Stretch Limitations  seated 3 way prayer stretch with green Pball      Lumbar Exercises: Standing   Functional Squats  15 reps;3 seconds     Functional Squats Limitations  TRX - cues for proper alignment    Forward Lunge  10 reps;3 seconds   R & L   Forward Lunge Limitations  cues for upright trunk posture & to avoid knee past toes    Row  Both;15 reps;Theraband;Strengthening    Theraband Level (Row)  Level 2 (Red)    Row Limitations  staggered stance; cues for abd bracing & scap retraction    Shoulder Extension  Both;15 reps;Theraband;Strengthening    Theraband Level (Shoulder Extension)  Level 2 (Red)    Shoulder Extension Limitations  staggered stance; cues for abd bracing & scap retraction    Other Standing Lumbar Exercises  B pallof press with red TB x15      Lumbar Exercises: Seated   Long Arc Quad on [place]  Both;10 reps    Hip Flexion on [place]  Both;10 reps      Knee/Hip Exercises: Aerobic   Recumbent Bike  L3 x 6 min  PT Education - 11/07/17 1745    Education Details  HEP update    Person(s) Educated  Patient    Methods  Explanation;Demonstration;Handout    Comprehension  Verbalized understanding;Returned demonstration       PT Short Term Goals - 10/31/17 1632      PT SHORT TERM GOAL #1   Title  Independent with initial HEP    Status  Achieved        PT Long Term Goals - 10/24/17 1630      PT LONG TERM GOAL #1   Title  Independent with ongoing/advanced HEP    Status  On-going      PT LONG TERM GOAL #2   Title  Pt will report reduction in hip and low back pain by >/= 50%    Status  On-going      PT LONG TERM GOAL #3   Title  Pt will demonstrate low back and hip ROM WFL w/o restriction to due pain    Status  On-going      PT LONG TERM GOAL #4   Title  Pt to report ability to perform work and daily activities without pain provocation    Status  On-going      PT LONG TERM GOAL #5   Title  Pt to demonstrate appropriate posture and body mechanics needed for daily activities    Status  On-going            Plan - 11/07/17 1707    Clinical Impression Statement  Craig Bishop  reporting he has noticed any hip pain over the past few days and states the radicular pain reported last session has not returned since. Pt stating HEP exercises are strating to get easier, therefore provided green looped TB for HEP resistance progression and introduced more upright lumbopelvic and core strengthening with good tolerance, therefore added some of these to HEP as well.    Rehab Potential  Good    PT Treatment/Interventions  Patient/family education;Neuromuscular re-education;Therapeutic exercise;Therapeutic activities;Functional mobility training;ADLs/Self Care Home Management;Moist Heat;Electrical Stimulation;Cryotherapy;Ultrasound;Traction;Iontophoresis 4mg /ml Dexamethasone;Manual techniques;Taping;Dry needling;Passive range of motion    Consulted and Agree with Plan of Care  Patient       Patient will benefit from skilled therapeutic intervention in order to improve the following deficits and impairments:  Pain, Increased muscle spasms, Impaired flexibility, Hypomobility, Decreased range of motion, Decreased strength, Decreased activity tolerance, Postural dysfunction, Improper body mechanics, Difficulty walking, Decreased mobility  Visit Diagnosis: Pain in right hip  Acute bilateral low back pain with right-sided sciatica  Pain in left hip  Other muscle spasm     Problem List Patient Active Problem List   Diagnosis Date Noted  . Left shoulder pain 04/05/2017    Marry Guan, PT, MPT 11/07/2017, 6:05 PM  [facility] [address]  [address] [place], [place], [address] Phone: [phone]   Fax:  [phone]  Name: Craig Bishop MRN: [id number] Date of Birth: 1985/04/19

## 2017-11-09 ENCOUNTER — Ambulatory Visit: Payer: Medicare Other

## 2017-11-09 DIAGNOSIS — M5441 Lumbago with sciatica, right side: Secondary | ICD-10-CM

## 2017-11-09 DIAGNOSIS — M25552 Pain in left hip: Secondary | ICD-10-CM

## 2017-11-09 DIAGNOSIS — M62838 Other muscle spasm: Secondary | ICD-10-CM

## 2017-11-09 DIAGNOSIS — M25551 Pain in right hip: Secondary | ICD-10-CM

## 2017-11-09 NOTE — Therapy (Signed)
[facility] [address]  [place], [place], [address] Phone: [phone]   Fax:  [phone]  Physical Therapy Treatment  Patient Details  Name: Craig Bishop MRN: [id number] Date of Birth: 11/09/1985 Referring Provider (PT): Karlton Lemon, MD   Encounter Date: 11/09/2017  PT End of Session - 11/09/17 1707    Visit Number  8    Number of Visits  12    Date for PT Re-Evaluation  11/15/17    Authorization Type  Medicare & Medicaid    PT Start Time  1702    PT Stop Time  1745    PT Time Calculation (min)  43 min    Activity Tolerance  Patient tolerated treatment well    Behavior During Therapy  [facility] for tasks assessed/performed       Past Medical History:  Diagnosis Date  . HIV positive ([place])   . Ulcerative colitis ([place])     No past surgical history on file.  There were no vitals filed for this visit.  Subjective Assessment - 11/09/17 1707    Subjective  Pt. noting R foot was tingling, and R hip hurt without known trigger last night in bed for a short time.      Pertinent History  HIV positive    Diagnostic tests  10/29/17 - Lumbar MRI: Normal lumbar spine MRI.  07/26/17 - B hip/pelvis x-rays: No acute fracture or malalignment.     Patient Stated Goals  "to be pain free if possible or find ways to cope with the pain"    Currently in Pain?  Yes    Pain Score  3     Pain Location  Back    Pain Orientation  Lower    Pain Descriptors / Indicators  Throbbing;Dull    Pain Type  Acute pain    Pain Radiating Towards  none today    Pain Onset  More than a month ago    Pain Frequency  Constant    Aggravating Factors   sleeping    Pain Relieving Factors  stretching     Multiple Pain Sites  No         [facility] PT Assessment - 11/09/17 1734      AROM   Lumbar Flexion  hands to ankles     Lumbar Extension  25% - lower back pain up to 7/10 at end range     Lumbar - Right Side Bend  [facility]    Lumbar - Left Side  Bend  [facility]    Lumbar - Right Rotation  [facility]    Lumbar - Left Rotation  [facility]                   [facility] Adult PT Treatment/Exercise - 11/09/17 1713      Therapeutic Activites    Therapeutic Activities  Lifting    Lifting  Pt. reporting he lifts frequently at work 5-50# from floor to waist height; instructed pt. in proper lifting technique featuring neutral spine and cueing for increased hip/knee flexion with 20# x 5 rep, 50# boxes x 5 reps       Lumbar Exercises: Stretches   Lumbar Stabilization Level 1  3 reps;20 seconds    Lumbar Stabilization Level 1 Limitations  3-way lumbar stretch rolling green p-ball out from table in seated     Piriformis Stretch  Right;1 rep;30 seconds    Piriformis Stretch Limitations  supine KTOS  Other Lumbar Stretch Exercise  Standing hip hinge with dowel 5" x 10 reps       Lumbar Exercises: Standing   Functional Squats  15 reps;3 seconds    Functional Squats Limitations  TRX - cues for proper alignment    Other Standing Lumbar Exercises  Standing lumbar extension stretch 5" x 10 reps with cueing to avoid painful end range       Lumbar Exercises: Supine   Bridge  15 reps;5 seconds    Bridge Limitations  +hip ABD isometric with green TB      Knee/Hip Exercises: Aerobic   Recumbent Bike  L3 x 6 min               PT Short Term Goals - 10/31/17 1632      PT SHORT TERM GOAL #1   Title  Independent with initial HEP    Status  Achieved        PT Long Term Goals - 11/09/17 1722      PT LONG TERM GOAL #1   Title  Independent with ongoing/advanced HEP    Status  Partially Met      PT LONG TERM GOAL #2   Title  Pt will report reduction in hip and low back pain by >/= 50%    Status  Achieved      PT LONG TERM GOAL #3   Title  Pt will demonstrate low back and hip ROM WFL w/o restriction to due pain    Status  Partially Met      PT LONG TERM GOAL #4   Title  Pt to report ability to perform work and daily activities without pain  provocation    Status  On-going      PT LONG TERM GOAL #5   Title  Pt to demonstrate appropriate posture and body mechanics needed for daily activities    Status  On-going            Plan - 11/09/17 1710    Clinical Impression Statement  Pt. noting he has been wearing R wrist brace as work has irritated R wrist and requested to avoid prone press ups today.  Pt. noting 80-85% improvement in back pain since starting therapy achieving goal.  Pt. unable to demo proper lifting technique today with simulated lifting with 20-50# box in session however technique improved with instruction.  Pt. requiring cueing for increase hip/knee flexion and neutral spine posture and will likely need further skilled instruction in upcoming visit. Pt. unsure if he wishes continue with therapy following next visit however plans to get back with therapist at next visit regarding his wishes.  Will plan for final goals testing and possible recert vs d/c at upcoming visit.      PT Treatment/Interventions  Patient/family education;Neuromuscular re-education;Therapeutic exercise;Therapeutic activities;Functional mobility training;ADLs/Self Care Home Management;Moist Heat;Electrical Stimulation;Cryotherapy;Ultrasound;Traction;Iontophoresis 56m/ml Dexamethasone;Manual techniques;Taping;Dry needling;Passive range of motion    Consulted and Agree with Plan of Care  Patient       Patient will benefit from skilled therapeutic intervention in order to improve the following deficits and impairments:  Pain, Increased muscle spasms, Impaired flexibility, Hypomobility, Decreased range of motion, Decreased strength, Decreased activity tolerance, Postural dysfunction, Improper body mechanics, Difficulty walking, Decreased mobility  Visit Diagnosis: Pain in right hip  Acute bilateral low back pain with right-sided sciatica  Pain in left hip  Other muscle spasm     Problem List Patient Active Problem List   Diagnosis Date  Noted  .  Left shoulder pain 04/05/2017    Bess Harvest, PTA 11/09/17 6:03 PM   [facility] [address]  [place], [place], [address] Phone: [phone]   Fax:  [phone]  Name: Craig Bishop MRN: [id number] Date of Birth: 04-18-1985

## 2017-11-14 ENCOUNTER — Ambulatory Visit: Payer: Medicare Other | Admitting: Physical Therapy

## 2017-11-14 DIAGNOSIS — M5441 Lumbago with sciatica, right side: Secondary | ICD-10-CM

## 2017-11-14 DIAGNOSIS — M25552 Pain in left hip: Secondary | ICD-10-CM

## 2017-11-14 DIAGNOSIS — M25551 Pain in right hip: Secondary | ICD-10-CM | POA: Diagnosis not present

## 2017-11-14 DIAGNOSIS — M62838 Other muscle spasm: Secondary | ICD-10-CM

## 2017-11-14 NOTE — Therapy (Signed)
[facility] [address]  [place], [place], [address] Phone: [phone]   Fax:  [phone]  Physical Therapy Treatment/Discharge summary  Patient Details  Name: Craig Bishop MRN: [id number] Date of Birth: 05-18-1985 Referring Provider (PT): Karlton Lemon, MD   Encounter Date: 11/14/2017  PT End of Session - 11/14/17 1631    Visit Number  9    Number of Visits  12    Authorization Type  Medicare & Medicaid    PT Start Time  5027    PT Stop Time  1626   last 10 min on heat   PT Time Calculation (min)  51 min    Activity Tolerance  Patient tolerated treatment well    Behavior During Therapy  [facility] for tasks assessed/performed       Past Medical History:  Diagnosis Date  . HIV positive ([place])   . Ulcerative colitis ([place])     No past surgical history on file.  There were no vitals filed for this visit.  Subjective Assessment - 11/14/17 1553    Subjective  Pt relays he feels he is ready for D/C. He now knows what to do for this back, he just needs to keep up with it.    Pertinent History  HIV positive    Pain Score  2     Pain Location  Back                       [facility] Adult PT Treatment/Exercise - 11/14/17 0001      Exercises   Exercises  Lumbar      Lumbar Exercises: Stretches   Passive Hamstring Stretch  Right;Left;2 reps;30 seconds    Single Knee to Chest Stretch  Right;Left;2 reps;30 seconds    Lumbar Stabilization Level 1  3 reps;20 seconds    Lumbar Stabilization Level 1 Limitations  3-way lumbar stretch rolling green p-ball out from table in seated     Piriformis Stretch  Right;Left;2 reps;30 seconds      Lumbar Exercises: Standing   Functional Squats  20 reps;3 seconds    Functional Squats Limitations  TRX    Row  15 reps    Row Limitations  TRX    Other Standing Lumbar Exercises  Standing lumbar extension stretch 5" x 10 reps with cueing to avoid painful end range        Lumbar Exercises: Supine   Bridge  20 reps;5 seconds      Knee/Hip Exercises: Aerobic   Recumbent Bike  L3X 6 min      Modalities   Modalities  Moist Heat      Moist Heat Therapy   Number Minutes Moist Heat  10 Minutes    Moist Heat Location  Lumbar Spine               PT Short Term Goals - 10/31/17 1632      PT SHORT TERM GOAL #1   Title  Independent with initial HEP    Status  Achieved        PT Long Term Goals - 11/14/17 1552      PT LONG TERM GOAL #1   Title  Independent with ongoing/advanced HEP    Status  Achieved      PT LONG TERM GOAL #2   Title  Pt will report reduction in hip and low back pain by >/= 50%    Status  Achieved  PT LONG TERM GOAL #3   Title  Pt will demonstrate low back and hip ROM WFL w/o restriction to due pain    Status  Achieved      PT LONG TERM GOAL #4   Title  Pt to report ability to perform work and daily activities without pain provocation    Status  Partially Met      PT LONG TERM GOAL #5   Title  Pt to demonstrate appropriate posture and body mechanics needed for daily activities    Status  Achieved            Plan - 11/14/17 1632    Clinical Impression Statement  Pt has made great progress with PT and is satisfied with his funcitonal level. He has increased his ROM and strength but does still miss small ext ROM due to pain. He now feels he knows how to manage his pain and what to do at home and wishes to D/C from PT.  He achieved STGs and 4/5 LTG's. He will be D/C to HEP today and he had no further questions or concerns.    Consulted and Agree with Plan of Care  Patient       Patient will benefit from skilled therapeutic intervention in order to improve the following deficits and impairments:     Visit Diagnosis: Pain in right hip  Acute bilateral low back pain with right-sided sciatica  Pain in left hip  Other muscle spasm     Problem List Patient Active Problem List   Diagnosis Date Noted   . Left shoulder pain 04/05/2017    Debbe Odea, PT,DPT 11/14/2017, 4:35 PM  [facility] [address]  [place], [place], [address] Phone: [phone]   Fax:  [phone]  Name: Craig Bishop MRN: [id number] Date of Birth: 1985/02/18  PHYSICAL THERAPY DISCHARGE SUMMARY  Visits from Start of Care: 9  Current functional level related to goals / functional outcomes: See above   Remaining deficits: See above   Education / Equipment: Final HEP Plan: Patient agrees to discharge.  Patient goals were partially met. Patient is being discharged due to being pleased with the current functional level.  ?????    Elsie Ra, PT, DPT 11/14/17 4:37 PM

## 2017-11-16 ENCOUNTER — Ambulatory Visit: Payer: Medicare Other | Admitting: Physical Therapy

## 2018-07-20 ENCOUNTER — Other Ambulatory Visit: Payer: Self-pay | Admitting: Family Medicine

## 2018-07-28 ENCOUNTER — Other Ambulatory Visit: Payer: Self-pay | Admitting: *Deleted

## 2018-07-28 MED ORDER — DICLOFENAC SODIUM 75 MG PO TBEC: 75.0000 mg | DELAYED_RELEASE_TABLET | Freq: Two times a day (BID) | ORAL | 0 refills | Status: DC

## 2018-08-09 ENCOUNTER — Encounter (HOSPITAL_BASED_OUTPATIENT_CLINIC_OR_DEPARTMENT_OTHER): Payer: Self-pay | Admitting: *Deleted

## 2018-08-09 ENCOUNTER — Other Ambulatory Visit: Payer: Self-pay

## 2018-08-09 ENCOUNTER — Emergency Department (HOSPITAL_BASED_OUTPATIENT_CLINIC_OR_DEPARTMENT_OTHER)
Admission: EM | Admit: 2018-08-09 | Discharge: 2018-08-09 | Disposition: A | Discharge status: Home / Self Care | Payer: Medicare Other | Attending: Emergency Medicine | Admitting: Emergency Medicine

## 2018-08-09 DIAGNOSIS — Z79899 Other long term (current) drug therapy: Secondary | ICD-10-CM | POA: Insufficient documentation

## 2018-08-09 DIAGNOSIS — H53149 Visual discomfort, unspecified: Secondary | ICD-10-CM

## 2018-08-09 DIAGNOSIS — H5712 Ocular pain, left eye: Secondary | ICD-10-CM

## 2018-08-09 DIAGNOSIS — H53142 Visual discomfort, left eye: Secondary | ICD-10-CM | POA: Insufficient documentation

## 2018-08-09 DIAGNOSIS — F17210 Nicotine dependence, cigarettes, uncomplicated: Secondary | ICD-10-CM | POA: Insufficient documentation

## 2018-08-09 DIAGNOSIS — Z21 Asymptomatic human immunodeficiency virus [HIV] infection status: Secondary | ICD-10-CM | POA: Insufficient documentation

## 2018-08-09 MED ORDER — TOBRAMYCIN 0.3 % OP SOLN: 1.0000 [drp] | Freq: Four times a day (QID) | OPHTHALMIC | 0 refills | Status: AC

## 2018-08-09 MED ORDER — FLUORESCEIN SODIUM 1 MG OP STRP
ORAL_STRIP | OPHTHALMIC | Status: AC
  Filled 2018-08-09: qty 1

## 2018-08-09 MED ORDER — TETRACAINE HCL 0.5 % OP SOLN
OPHTHALMIC | Status: AC
  Filled 2018-08-09: qty 4

## 2018-08-09 MED FILL — TOBRAMYCIN 0.3 % SOLN: 0.3 | 25 days supply | Qty: 5 | Fill #0

## 2018-08-09 NOTE — ED Provider Notes (Signed)
[facility] EMERGENCY DEPARTMENT Provider Note   CSN: [id number] Arrival date & time: 08/09/18  1450    History   Chief Complaint Chief Complaint  Patient presents with  . Eye Pain    HPI Craig Bishop is a 33 y.o. male positive history of HIV who presents for evaluation of left eye pain, redness that began last night.  He states that it felt irritated last night and he had his contact in his eye that he had difficulty getting out.  He states that he decided to go to sleep with the contact in.  He reports waking up today and had redness and pain noted to the right eye.  He reports associated photophobia.  Denies any fever or vision changes.  Denies any trauma to the eye.  He has been able to take the contact out since then.  He came into the emergency department today because he continued to have significant pain in that right eye.     The history is provided by the patient.    Past Medical History:  Diagnosis Date  . HIV positive (HCC)   . Ulcerative colitis [facility](HCC)     Patient Active Problem List   Diagnosis Date Noted  . Left shoulder pain 04/05/2017    History reviewed. No pertinent surgical history.      Home Medications    Prior to Admission medications   Medication Sig Start Date End Date Taking? Authorizing Provider  Darun-Cobic-Emtricit-TenofAF (SYMTUZA PO) Take by mouth.   Yes [provider]  dapsone 100 MG tablet TAKE ONE TABLET BY MOUTH DAILY 08/10/17   [provider]  Darunavir-Cobicisctat-Emtricitabine-Tenofovir Alafenamide (SYMTUZA) 800-150-200-10 MG TABS TAKE ONE TABLET BY MOUTH DAILY WITH FOOD 08/10/17   [provider]  diclofenac (VOLTAREN) 75 MG EC tablet Take 1 tablet (75 mg total) by mouth 2 (two) times daily. 07/28/18   Hudnall, Azucena FallenShane R, MD  tiZANidine (ZANAFLEX) 4 MG tablet Take 1 tablet (4 mg total) by mouth every 8 (eight) hours as needed. 09/21/17   Hudnall, Azucena FallenShane R, MD  tobramycin (TOBREX) 0.3 % ophthalmic  solution Place 1 drop into the left eye 4 (four) times daily. 08/09/18   Maxwell CaulLayden, Lindsey A, PA-C    Family History History reviewed. No pertinent family history.  Social History Social History   Tobacco Use  . Smoking status: Current Every Day Smoker    Types: Cigarettes  . Smokeless tobacco: Never Used  Substance Use Topics  . Alcohol use: Not Currently  . Drug use: No     Allergies   Bactrim [sulfamethoxazole-trimethoprim], Codeine, Hydrocodone, and Oxycodone   Review of Systems Review of Systems  Constitutional: Negative for fever.  Eyes: Positive for photophobia, pain and redness.  All other systems reviewed and are negative.    Physical Exam Updated Vital Signs BP (!) 142/88 (BP Location: Left Arm)   Pulse 84   Temp 98.9 F (37.2 C) (Oral)   Resp 20   Ht 6\' 3"  (1.905 m)   Wt 77.1 kg   SpO2 98%   BMI 21.25 kg/m   Physical Exam Vitals signs and nursing note reviewed.  Constitutional:      Appearance: He is well-developed.  HENT:     Head: Normocephalic and atraumatic.  Eyes:     General: No scleral icterus.       Right eye: No discharge.        Left eye: No discharge.     Conjunctiva/sclera: Conjunctivae normal.  Comments: Conjunctival injection noted to left eye.  Right pupil is about 4 mm and reactive.  Left pupil is about 3 mm and reactive.  EOMs intact without any difficulty.  No swelling, warmth, erythema, edema noted to periorbital regions bilaterally.  Consensual pain noted. No hyphemia or hypopion.   Pulmonary:     Effort: Pulmonary effort is normal.  Skin:    General: Skin is warm and dry.  Neurological:     Mental Status: He is alert.  Psychiatric:        Speech: Speech normal.        Behavior: Behavior normal.      ED Treatments / Results  Labs (all labs ordered are listed, but only abnormal results are displayed) Labs Reviewed - No data to display  EKG None  Radiology No results found.  Procedures Procedures (including  critical care time)  Medications Ordered in ED Medications  fluorescein 1 MG ophthalmic strip (has no administration in time range)  tetracaine (PONTOCAINE) 0.5 % ophthalmic solution (has no administration in time range)     Initial Impression / Assessment and Plan / ED Course  I have reviewed the triage vital signs and the nursing notes.  Pertinent labs & imaging results that were available during my care of the patient were reviewed by me and considered in my medical decision making (see chart for details).        34 year old male with past medical story of HIV who presents for evaluation of left eye pain, redness, photophobia that has been ongoing since last night.  Left is contacted last night when he went to sleep.  Woke up this morning with worsening pain, redness.  Was able to take the contact out.  No fevers, facial swelling.  He does have photophobia.  Concern for corneal abrasion versus ulcer versus iritis.  History/physical exam not concerning for preseptal or orbital cellulitis.  Evaluation with Sherral Hammers lamp showed no evidence of fluorescein uptake, corneal abrasion.  No dendritic lesion.  Slit-lamp she did not show any evidence of cells or flares.   Intraocular pressure as documented below:  Left IOP: 13, 13, 14 Right IOP: 14, 13, 16     Visual Acuity  Right Eye Distance: 20/13 Left Eye Distance: 20/20 Bilateral Distance: 2013    Discussed patient with Dr. Alanda Slim (Optho).  He recommends that I start him on tobramycin 4 times a day and will plan to see him in the office tomorrow.  Updated patient on plan.  He is agreeable. At this time, patient exhibits no emergent life-threatening condition that require further evaluation in ED or admission. Patient had ample opportunity for questions and discussion. All patient's questions were answered with full understanding. Strict return precautions discussed. Patient expresses understanding and agreement to plan.   Portions of  this note were generated with [employer]. Dictation errors may occur despite best attempts at proofreading.   Final Clinical Impressions(s) / ED Diagnoses   Final diagnoses:  Left eye pain  Photophobia    ED Discharge Orders         Ordered    tobramycin (TOBREX) 0.3 % ophthalmic solution  4 times daily     08/09/18 1619           Desma Mcgregor 08/09/18 2226    Blanchie Dessert, MD 08/09/18 2344

## 2018-08-09 NOTE — Discharge Instructions (Signed)
Use eyedrops as directed.  As we discussed, it is important that you follow-up with eye doctor tomorrow.  Call their office and arrange for time to be seen tomorrow morning.  Return emergency department for any fever, redness or swelling of face, vision changes or any other worsening or concerning symptoms.

## 2018-08-09 NOTE — ED Triage Notes (Signed)
Pt states he slept with contact in left eye last night-removed today-c/o pain-NAD-steady gait
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
## 2020-04-16 ENCOUNTER — Emergency Department (HOSPITAL_BASED_OUTPATIENT_CLINIC_OR_DEPARTMENT_OTHER)
Admission: EM | Admit: 2020-04-16 | Discharge: 2020-04-16 | Disposition: A | Discharge status: Home / Self Care | Payer: Self-pay | Attending: Emergency Medicine | Admitting: Emergency Medicine

## 2020-04-16 ENCOUNTER — Encounter (HOSPITAL_BASED_OUTPATIENT_CLINIC_OR_DEPARTMENT_OTHER): Payer: Self-pay

## 2020-04-16 ENCOUNTER — Other Ambulatory Visit: Payer: Self-pay

## 2020-04-16 DIAGNOSIS — Z21 Asymptomatic human immunodeficiency virus [HIV] infection status: Secondary | ICD-10-CM

## 2020-04-16 DIAGNOSIS — B360 Pityriasis versicolor: Secondary | ICD-10-CM

## 2020-04-16 DIAGNOSIS — F17210 Nicotine dependence, cigarettes, uncomplicated: Secondary | ICD-10-CM | POA: Insufficient documentation

## 2020-04-16 DIAGNOSIS — Z79899 Other long term (current) drug therapy: Secondary | ICD-10-CM | POA: Insufficient documentation

## 2020-04-16 DIAGNOSIS — B20 Human immunodeficiency virus [HIV] disease: Secondary | ICD-10-CM | POA: Insufficient documentation

## 2020-04-16 DIAGNOSIS — R21 Rash and other nonspecific skin eruption: Secondary | ICD-10-CM

## 2020-04-16 LAB — CBC WITH DIFFERENTIAL/PLATELET
Abs Immature Granulocytes: 0.01 10*3/uL (ref 0.00–0.07)
Basophils Absolute: 0 10*3/uL (ref 0.0–0.1)
Basophils Relative: 1 %
Eosinophils Absolute: 0.2 10*3/uL (ref 0.0–0.5)
Eosinophils Relative: 5 %
HCT: 44.2 % (ref 39.0–52.0)
Hemoglobin: 14.9 g/dL (ref 13.0–17.0)
Immature Granulocytes: 0 %
Lymphocytes Relative: 25 %
Lymphs Abs: 1 10*3/uL (ref 0.7–4.0)
MCH: 30.7 pg (ref 26.0–34.0)
MCHC: 33.7 g/dL (ref 30.0–36.0)
MCV: 91.1 fL (ref 80.0–100.0)
Monocytes Absolute: 0.3 10*3/uL (ref 0.1–1.0)
Monocytes Relative: 7 %
Neutro Abs: 2.6 10*3/uL (ref 1.7–7.7)
Neutrophils Relative %: 62 %
Platelets: 147 10*3/uL — ABNORMAL LOW (ref 150–400)
RBC: 4.85 MIL/uL (ref 4.22–5.81)
RDW: 14.3 % (ref 11.5–15.5)
Smear Review: NORMAL
WBC: 4.1 10*3/uL (ref 4.0–10.5)
nRBC: 0 % (ref 0.0–0.2)

## 2020-04-16 LAB — COMPREHENSIVE METABOLIC PANEL
ALT: 23 U/L (ref 0–44)
AST: 24 U/L (ref 15–41)
Albumin: 4.2 g/dL (ref 3.5–5.0)
Alkaline Phosphatase: 86 U/L (ref 38–126)
Anion gap: 7 (ref 5–15)
BUN: 10 mg/dL (ref 6–20)
CO2: 27 mmol/L (ref 22–32)
Calcium: 9 mg/dL (ref 8.9–10.3)
Chloride: 102 mmol/L (ref 98–111)
Creatinine, Ser: 0.87 mg/dL (ref 0.61–1.24)
GFR, Estimated: >60 mL/min (ref >60)
Glucose, Bld: 110 mg/dL — ABNORMAL HIGH (ref 70–99)
Potassium: 4.2 mmol/L (ref 3.5–5.1)
Sodium: 136 mmol/L (ref 135–145)
Total Bilirubin: 0.2 mg/dL — ABNORMAL LOW (ref 0.3–1.2)
Total Protein: 8.3 g/dL — ABNORMAL HIGH (ref 6.5–8.1)

## 2020-04-16 LAB — T-HELPER CELLS (CD4) COUNT (NOT AT ARMC)
CD4 % Helper T Cell: 5 % — ABNORMAL LOW (ref 33–65)
CD4 T Cell Abs: 46 /uL — ABNORMAL LOW (ref 400–1790)

## 2020-04-16 LAB — CRYPTOCOCCAL ANTIGEN: Crypto Ag: NEGATIVE

## 2020-04-16 MED ORDER — FLUCONAZOLE 150 MG PO TABS: 300.0000 mg | ORAL_TABLET | Freq: Once | ORAL | 0 refills | Status: AC

## 2020-04-16 MED ORDER — HYDROXYZINE HCL 25 MG PO TABS: 25.0000 mg | ORAL_TABLET | Freq: Four times a day (QID) | ORAL | 0 refills | Status: AC | PRN

## 2020-04-16 MED ORDER — FLUCONAZOLE 150 MG PO TABS
300.0000 mg | ORAL_TABLET | Freq: Once | ORAL | Status: AC
  Administered 2020-04-16: 300 mg via ORAL
  Filled 2020-04-16: qty 2

## 2020-04-16 NOTE — ED Triage Notes (Signed)
Pt arrives with rash to neck, face and arms. States he has had it for several weeks. Used a new soap and sleep spray on his pillow a few weeks ago. Rash itches, denies difficulty breathing/swallowing

## 2020-04-16 NOTE — ED Provider Notes (Signed)
[facility] EMERGENCY DEPARTMENT Provider Note   CSN: [id number] Arrival date & time: 04/16/20  1123     History Chief Complaint  Patient presents with  . Allergic Reaction    Craig Bishop is a 35 y.o. male.  HPI      35yo male with history of HIV, UC, presents with concern for rash.  Reports it has been present for several weeks, 5 weeks ago used a new sleep spray on his pillow and sheets, did use new soap but stopped both of these and rash has continued.  Reports pruritis, rash over bilateral arms, neck and face. No rash on legs or torso. No fevers. No contacts with similar. Denies nausea, vomiting, diarrhea, cough or other concerns.  He reported he did have weeping of some lesions on his face but they look better, he thought things were improving but his mother was worried it was getting worse and encouraged him to come in.  He has been off of his HIV medications for at least 2 months, last visit seen in system was from 02/2019, had CD4 at that time of 110.    Past Medical History:  Diagnosis Date  . HIV positive (HCC)   . Ulcerative colitis [facility])     Patient Active Problem List   Diagnosis Date Noted  . Left shoulder pain 04/05/2017    History reviewed. No pertinent surgical history.     History reviewed. No pertinent family history.  Social History   Tobacco Use  . Smoking status: Current Every Day Smoker    Types: Cigarettes  . Smokeless tobacco: Never Used  Substance Use Topics  . Alcohol use: Not Currently  . Drug use: No    Home Medications Prior to Admission medications   Medication Sig Start Date End Date Taking? Authorizing Provider  fluconazole (DIFLUCAN) 150 MG tablet Take 2 tablets (300 mg total) by mouth once for 1 dose. 04/23/20 04/23/20 Yes Alvira Monday, MD  hydrOXYzine (ATARAX/VISTARIL) 25 MG tablet Take 1-2 tablets (25-50 mg total) by mouth every 6 (six) hours as needed for itching. 04/16/20  Yes Alvira Monday, MD  dapsone  100 MG tablet TAKE ONE TABLET BY MOUTH DAILY 08/10/17   [provider]  Darun-Cobic-Emtricit-TenofAF (SYMTUZA PO) Take by mouth.    [provider]  Darunavir-Cobicisctat-Emtricitabine-Tenofovir Alafenamide (SYMTUZA) 800-150-200-10 MG TABS TAKE ONE TABLET BY MOUTH DAILY WITH FOOD 08/10/17   [provider]  diclofenac (VOLTAREN) 75 MG EC tablet Take 1 tablet (75 mg total) by mouth 2 (two) times daily. 07/28/18   Hudnall, Azucena Fallen, MD  tiZANidine (ZANAFLEX) 4 MG tablet Take 1 tablet (4 mg total) by mouth every 8 (eight) hours as needed. 09/21/17   Hudnall, Azucena Fallen, MD  tobramycin (TOBREX) 0.3 % ophthalmic solution Place 1 drop into the left eye 4 (four) times daily. 08/09/18   Maxwell Caul, PA-C    Allergies    Bactrim [sulfamethoxazole-trimethoprim], Codeine, Hydrocodone, and Oxycodone  Review of Systems   Review of Systems  Constitutional: Negative for fever.  Eyes: Negative for visual disturbance.  Respiratory: Negative for shortness of breath.   Cardiovascular: Negative for chest pain.  Gastrointestinal: Negative for abdominal pain, diarrhea, nausea and vomiting.  Genitourinary: Negative for difficulty urinating.  Skin: Positive for rash.  Neurological: Negative for syncope and headaches.    Physical Exam Updated Vital Signs BP 126/86 (BP Location: Left Arm)   Pulse 86   Temp 98 F (36.7 C) (Oral)   Resp 18  Ht 6\' 3"  (1.905 m)   Wt 77.1 kg   SpO2 100%   BMI 21.25 kg/m   Physical Exam Vitals and nursing note reviewed.  Constitutional:      General: He is not in acute distress.    Appearance: Normal appearance. He is not ill-appearing, toxic-appearing or diaphoretic.  HENT:     Head: Normocephalic.  Eyes:     Conjunctiva/sclera: Conjunctivae normal.  Cardiovascular:     Rate and Rhythm: Normal rate and regular rhythm.     Pulses: Normal pulses.  Pulmonary:     Effort: Pulmonary effort is normal. No respiratory distress.  Musculoskeletal:         General: No deformity or signs of injury.     Cervical back: No rigidity.  Skin:    General: Skin is warm and dry.     Coloration: Skin is not jaundiced or pale.     Comments: See photo Hypopigmented patches over face, circular with confluence extending over nose Bilateral forearms with erythematous papules Neck with more violaceous/erythematous plaque  Neurological:     General: No focal deficit present.     Mental Status: He is alert and oriented to person, place, and time.              ED Results / Procedures / Treatments   Labs (all labs ordered are listed, but only abnormal results are displayed) Labs Reviewed  T-HELPER CELLS (CD4) COUNT (NOT AT [facility]) - Abnormal; Notable for the following components:      Result Value   CD4 T Cell Abs 46 (*)    CD4 % Helper T Cell 5 (*)    All other components within normal limits  CBC WITH DIFFERENTIAL/PLATELET - Abnormal; Notable for the following components:   Platelets 147 (*)    All other components within normal limits  COMPREHENSIVE METABOLIC PANEL - Abnormal; Notable for the following components:   Glucose, Bld 110 (*)    Total Protein 8.3 (*)    Total Bilirubin 0.2 (*)    All other components within normal limits  CRYPTOCOCCAL ANTIGEN  RPR  HIV-1 RNA QUANT-NO REFLEX-BLD    EKG None  Radiology No results found.  Procedures Procedures   Medications Ordered in ED Medications  fluconazole (DIFLUCAN) tablet 300 mg (300 mg Oral Given 04/16/20 1403)    ED Course  I have reviewed the triage vital signs and the nursing notes.  Pertinent labs & imaging results that were available during my care of the patient were reviewed by me and considered in my medical decision making (see chart for details).    MDM Rules/Calculators/A&P                          35 year old male with history of HIV with last CD4 count in February 2021 of 110, off of medications for at least 2 months, who presents with concern for rash  for one month.   Rash does not have the appearance of SSS, TEN, erythroderma, scabies, RMSF, other bacterial infection or hives.  Given history of possible new exposures and pruritis consider contact dermatitis, however he has stopped possible offending agents and symptoms continued.  Exam shows rash with different appearance in different areas and given immunodeficiency one year ago and now off of medications he is high risk for other complications of HIV/AIDS as etiology of rash.  He had most recently seen [facility] 02/2019 and discussed with Dr. 03/2019.  Will  send basic labs including CD4, viral quant, RPR. Also sent cryptococcal antigen.  They contacted patient while he was in the ED to set up an appointment in the next few days.    Has pruritic hypopigmented areas on the face, consider tinea versicolor. Given possible more diffuse tinea will treat with oral medications 300mg  fluconazole now and again in one week.  Given hydroxyzine rx for itching. Pt to see ID for further evaluation within next few days.      Final Clinical Impression(s) / ED Diagnoses Final diagnoses:  Rash  Tinea versicolor  HIV infection, unspecified symptom status (HCC)    Rx / DC Orders ED Discharge Orders         Ordered    fluconazole (DIFLUCAN) 150 MG tablet   Once        04/16/20 1404    hydrOXYzine (ATARAX/VISTARIL) 25 MG tablet  Every 6 hours PRN        04/16/20 1404           04/18/20, MD 04/17/20 [phone]

## 2020-04-17 LAB — HIV-1 RNA QUANT-NO REFLEX-BLD
HIV 1 RNA Quant: 516000 copies/mL
LOG10 HIV-1 RNA: 5.713 log10copy/mL

## 2020-04-17 LAB — RPR: RPR Ser Ql: NONREACTIVE

## 2022-07-07 ENCOUNTER — Emergency Department (HOSPITAL_BASED_OUTPATIENT_CLINIC_OR_DEPARTMENT_OTHER)
Admission: EM | Admit: 2022-07-07 | Discharge: 2022-07-07 | Disposition: A | Discharge status: Home / Self Care | Payer: Self-pay | Attending: Emergency Medicine | Admitting: Emergency Medicine

## 2022-07-07 ENCOUNTER — Encounter (HOSPITAL_BASED_OUTPATIENT_CLINIC_OR_DEPARTMENT_OTHER): Payer: Self-pay | Admitting: Emergency Medicine

## 2022-07-07 ENCOUNTER — Other Ambulatory Visit: Payer: Self-pay

## 2022-07-07 DIAGNOSIS — E860 Dehydration: Secondary | ICD-10-CM | POA: Insufficient documentation

## 2022-07-07 DIAGNOSIS — N3000 Acute cystitis without hematuria: Secondary | ICD-10-CM | POA: Insufficient documentation

## 2022-07-07 DIAGNOSIS — Z21 Asymptomatic human immunodeficiency virus [HIV] infection status: Secondary | ICD-10-CM | POA: Insufficient documentation

## 2022-07-07 DIAGNOSIS — D709 Neutropenia, unspecified: Secondary | ICD-10-CM | POA: Insufficient documentation

## 2022-07-07 DIAGNOSIS — Z20822 Contact with and (suspected) exposure to covid-19: Secondary | ICD-10-CM | POA: Insufficient documentation

## 2022-07-07 DIAGNOSIS — R110 Nausea: Secondary | ICD-10-CM

## 2022-07-07 LAB — COMPREHENSIVE METABOLIC PANEL
ALT: 21 U/L (ref 0–44)
AST: 38 U/L (ref 15–41)
Albumin: 4 g/dL (ref 3.5–5.0)
Alkaline Phosphatase: 93 U/L (ref 38–126)
Anion gap: 10 (ref 5–15)
BUN: 17 mg/dL (ref 6–20)
CO2: 24 mmol/L (ref 22–32)
Calcium: 8.3 mg/dL — ABNORMAL LOW (ref 8.9–10.3)
Chloride: 97 mmol/L — ABNORMAL LOW (ref 98–111)
Creatinine, Ser: 1.33 mg/dL — ABNORMAL HIGH (ref 0.61–1.24)
GFR, Estimated: >60 mL/min (ref >60)
Glucose, Bld: 108 mg/dL — ABNORMAL HIGH (ref 70–99)
Potassium: 3.1 mmol/L — ABNORMAL LOW (ref 3.5–5.1)
Sodium: 131 mmol/L — ABNORMAL LOW (ref 135–145)
Total Bilirubin: 0.6 mg/dL (ref 0.3–1.2)
Total Protein: 7.9 g/dL (ref 6.5–8.1)

## 2022-07-07 LAB — CBC WITH DIFFERENTIAL/PLATELET
Abs Immature Granulocytes: 0.01 10*3/uL (ref 0.00–0.07)
Basophils Absolute: 0 10*3/uL (ref 0.0–0.1)
Basophils Relative: 1 %
Eosinophils Absolute: 0 10*3/uL (ref 0.0–0.5)
Eosinophils Relative: 0 %
HCT: 40.4 % (ref 39.0–52.0)
Hemoglobin: 13.7 g/dL (ref 13.0–17.0)
Immature Granulocytes: 1 %
Lymphocytes Relative: 53 %
Lymphs Abs: 1.1 10*3/uL (ref 0.7–4.0)
MCH: 31.1 pg (ref 26.0–34.0)
MCHC: 33.9 g/dL (ref 30.0–36.0)
MCV: 91.6 fL (ref 80.0–100.0)
Monocytes Absolute: 0.1 10*3/uL (ref 0.1–1.0)
Monocytes Relative: 6 %
Neutro Abs: 0.8 10*3/uL — ABNORMAL LOW (ref 1.7–7.7)
Neutrophils Relative %: 39 %
Platelets: 123 10*3/uL — ABNORMAL LOW (ref 150–400)
RBC: 4.41 MIL/uL (ref 4.22–5.81)
RDW: 14.9 % (ref 11.5–15.5)
Smear Review: DECREASED
WBC Morphology: ABNORMAL
WBC: 2.1 10*3/uL — ABNORMAL LOW (ref 4.0–10.5)
nRBC: 0 % (ref 0.0–0.2)

## 2022-07-07 LAB — URINALYSIS, ROUTINE W REFLEX MICROSCOPIC
Glucose, UA: NEGATIVE mg/dL
Hgb urine dipstick: NEGATIVE
Ketones, ur: NEGATIVE mg/dL
Leukocytes,Ua: NEGATIVE
Nitrite: NEGATIVE
Protein, ur: >=300 mg/dL — AB
Specific Gravity, Urine: >=1.03 (ref 1.005–1.030)
pH: 5.5 (ref 5.0–8.0)

## 2022-07-07 LAB — URINALYSIS, MICROSCOPIC (REFLEX)

## 2022-07-07 LAB — RESP PANEL BY RT-PCR (RSV, FLU A&B, COVID)  RVPGX2
Influenza A by PCR: NEGATIVE
Influenza B by PCR: NEGATIVE
Resp Syncytial Virus by PCR: NEGATIVE
SARS Coronavirus 2 by RT PCR: NEGATIVE

## 2022-07-07 MED ORDER — ONDANSETRON 4 MG PO TBDP: 4.0000 mg | ORAL_TABLET | Freq: Three times a day (TID) | ORAL | 0 refills | Status: AC | PRN

## 2022-07-07 MED ORDER — ACETAMINOPHEN 325 MG PO TABS
650.0000 mg | ORAL_TABLET | Freq: Once | ORAL | Status: DC
  Filled 2022-07-07: qty 2

## 2022-07-07 MED ORDER — CEPHALEXIN 500 MG PO CAPS: 500.0000 mg | ORAL_CAPSULE | Freq: Two times a day (BID) | ORAL | 0 refills | Status: AC

## 2022-07-07 MED ORDER — LACTATED RINGERS IV BOLUS
1000.0000 mL | Freq: Once | INTRAVENOUS | Status: AC
  Administered 2022-07-07: 1000 mL via INTRAVENOUS

## 2022-07-07 MED ORDER — CEPHALEXIN 250 MG PO CAPS
500.0000 mg | ORAL_CAPSULE | Freq: Once | ORAL | Status: AC
  Administered 2022-07-07: 500 mg via ORAL
  Filled 2022-07-07: qty 2

## 2022-07-07 MED ORDER — ONDANSETRON HCL 4 MG/2ML IJ SOLN
4.0000 mg | Freq: Once | INTRAMUSCULAR | Status: AC
  Administered 2022-07-07: 4 mg via INTRAVENOUS
  Filled 2022-07-07: qty 2

## 2022-07-07 NOTE — ED Notes (Signed)
ED Provider at bedside. 

## 2022-07-07 NOTE — ED Provider Notes (Signed)
[place] EMERGENCY DEPARTMENT AT [facility]  Provider Note  CSN: [id number] Arrival date & time: 07/07/22 0050  History Chief Complaint  Patient presents with   Fatigue    Craig Bishop is a 37 y.o. male with history of HIV, out of his meds for a couple of months. Has not had labs checked in about a year. He presents for evaluation of 3 days of generalized weakness, nausea without vomoting, poor appetite, headache and headache. 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 No known fevers. Other than headache, no pain. He reports his urine was cloudy but no dysuria or flank pain.    Home Medications Prior to Admission medications   Medication Sig Start Date End Date Taking? Authorizing Provider  cephALEXin (KEFLEX) 500 MG capsule Take 1 capsule (500 mg total) by mouth 2 (two) times daily for 7 days. 07/07/22 07/14/22 Yes Pollyann Savoy, MD  ondansetron (ZOFRAN-ODT) 4 MG disintegrating tablet Take 1 tablet (4 mg total) by mouth every 8 (eight) hours as needed for nausea or vomiting. 07/07/22  Yes Pollyann Savoy, MD  dapsone 100 MG tablet TAKE ONE TABLET BY MOUTH DAILY 08/10/17   [provider]  Darun-Cobic-Emtricit-TenofAF (SYMTUZA PO) Take by mouth.    [provider]  Darunavir-Cobicisctat-Emtricitabine-Tenofovir Alafenamide (SYMTUZA) 800-150-200-10 MG TABS TAKE ONE TABLET BY MOUTH DAILY WITH FOOD 08/10/17   [provider]  diclofenac (VOLTAREN) 75 MG EC tablet Take 1 tablet (75 mg total) by mouth 2 (two) times daily. 07/28/18   Hudnall, Azucena Fallen, MD  hydrOXYzine (ATARAX/VISTARIL) 25 MG tablet Take 1-2 tablets (25-50 mg total) by mouth every 6 (six) hours as needed for itching. 04/16/20   Alvira Monday, MD  tiZANidine (ZANAFLEX) 4 MG tablet Take 1 tablet (4 mg total) by mouth every 8 (eight) hours as needed. 09/21/17   Hudnall, Azucena Fallen, MD  tobramycin (TOBREX) 0.3 % ophthalmic solution Place 1 drop into the left eye 4  (four) times daily. 08/09/18   Maxwell Caul, PA-C     Allergies    Hydrocodone, Codeine, Oxycodone, and Sulfamethoxazole-trimethoprim   Review of Systems   Review of Systems Please see HPI for pertinent positives and negatives  Physical Exam BP 112/66   Pulse 68   Temp 99.4 F (37.4 C) (Oral)   Resp 17   Ht 6\' 3"  (1.905 m)   Wt 77.1 kg   SpO2 96%   BMI 21.25 kg/m   Physical Exam Vitals and nursing note reviewed.  Constitutional:      Appearance: Normal appearance.  HENT:     Head: Normocephalic and atraumatic.     Nose: Nose normal.     Mouth/Throat:     Mouth: Mucous membranes are dry.  Eyes:     Extraocular Movements: Extraocular movements intact.     Conjunctiva/sclera: Conjunctivae normal.  Cardiovascular:     Rate and Rhythm: Normal rate.  Pulmonary:     Effort: Pulmonary effort is normal.     Breath sounds: Normal breath sounds.  Abdominal:     General: Abdomen is flat.     Palpations: Abdomen is soft.     Tenderness: There is no abdominal tenderness.  Musculoskeletal:        General: No swelling. Normal range of motion.     Cervical back: Neck supple. No rigidity.  Skin:    General: Skin is warm and dry.  Neurological:     General: No focal  deficit present.     Mental Status: He is alert.  Psychiatric:        Mood and Affect: Mood normal.     ED Results / Procedures / Treatments   EKG None  Procedures Procedures  Medications Ordered in the ED Medications  acetaminophen (TYLENOL) tablet 650 mg (650 mg Oral Patient Refused/Not Given 07/07/22 0208)  cephALEXin (KEFLEX) capsule 500 mg (has no administration in time range)  ondansetron (ZOFRAN) injection 4 mg (4 mg Intravenous Given 07/07/22 0135)  lactated ringers bolus 1,000 mL (0 mLs Intravenous Stopped 07/07/22 0208)    Initial Impression and Plan  Patient here for vague symptoms of weakness, nausea and headache. Could be a viral syndrome but give HIV status and medication noncompliance,  will check labs and give a liter of IVF. Zofran for nausea.   ED Course   Clinical Course as of 07/07/22 0326  Wed Jul 07, 2022  0158 CMP with mildly increased Cr and decreased Na and K from baseline. Likely representing poor intake. CBC with low WBC, Diff is pending. Likely from viral illness and/or HIV [CS]  0206 Diff shows mild neutropenia. Does not have a fever here and none reported at home.  [CS]  0222 Covid/Flu/RSV swab is negative.  [CS]  0231 UA is concentrated, consistent with dehydration. Some signs of infection in a presumed immunocompromised patient. Will send for culture.  [CS]  [id number] Patient reports he is feeling much better, appetite is returned and he wants to go home. Discussed his results including dehydration and concern for UTI. He does not currently have any other signs of systemic infection. Will begin Abx, recommend close outpatient ID follow up to restart HIV meds and check his viral loads, CD4 counts etc. Advised to return for any worsening symptoms, fever, vomiting or any other concerns.  [CS]    Clinical Course User Index [CS] Pollyann Savoy, MD     MDM Rules/Calculators/A&P Medical Decision Making Given presenting complaint, I considered that admission might be necessary. After review of results from ED lab and/or imaging studies, admission to the hospital is not indicated at this time.    Problems Addressed: Acute cystitis without hematuria: acute illness or injury Dehydration: acute illness or injury Nausea: acute illness or injury Neutropenia, unspecified type (HCC): undiagnosed new problem with uncertain prognosis  Amount and/or Complexity of Data Reviewed Labs: ordered. Decision-making details documented in ED Course.  Risk OTC drugs. Prescription drug management. Decision regarding hospitalization.     Final Clinical Impression(s) / ED Diagnoses Final diagnoses:  Dehydration  Nausea  Neutropenia, unspecified type (HCC)  Acute cystitis  without hematuria    Rx / DC Orders ED Discharge Orders          Ordered    ondansetron (ZOFRAN-ODT) 4 MG disintegrating tablet  Every 8 hours PRN        07/07/22 0326    cephALEXin (KEFLEX) 500 MG capsule  2 times daily        07/07/22 0326             Pollyann Savoy, MD 07/07/22 [phone]

## 2022-07-07 NOTE — ED Triage Notes (Signed)
Pt reports feeling "unwell" since Sunday. Sx include decreased appetite, n/v, headache and fatigue.

## 2022-07-08 LAB — URINE CULTURE

## 2022-07-11 LAB — PATHOLOGIST SMEAR REVIEW

## 2022-09-19 ENCOUNTER — Other Ambulatory Visit: Payer: Self-pay

## 2022-09-19 ENCOUNTER — Encounter (HOSPITAL_BASED_OUTPATIENT_CLINIC_OR_DEPARTMENT_OTHER): Payer: Self-pay | Admitting: Urology

## 2022-09-19 ENCOUNTER — Emergency Department (HOSPITAL_BASED_OUTPATIENT_CLINIC_OR_DEPARTMENT_OTHER)
Admission: EM | Admit: 2022-09-19 | Discharge: 2022-09-19 | Disposition: A | Discharge status: Home / Self Care | Payer: Self-pay | Attending: Emergency Medicine | Admitting: Emergency Medicine

## 2022-09-19 DIAGNOSIS — R0981 Nasal congestion: Secondary | ICD-10-CM | POA: Insufficient documentation

## 2022-09-19 DIAGNOSIS — M2550 Pain in unspecified joint: Secondary | ICD-10-CM | POA: Insufficient documentation

## 2022-09-19 DIAGNOSIS — M791 Myalgia, unspecified site: Secondary | ICD-10-CM | POA: Insufficient documentation

## 2022-09-19 DIAGNOSIS — J069 Acute upper respiratory infection, unspecified: Secondary | ICD-10-CM

## 2022-09-19 DIAGNOSIS — Z20822 Contact with and (suspected) exposure to covid-19: Secondary | ICD-10-CM | POA: Insufficient documentation

## 2022-09-19 DIAGNOSIS — R519 Headache, unspecified: Secondary | ICD-10-CM | POA: Insufficient documentation

## 2022-09-19 DIAGNOSIS — R059 Cough, unspecified: Secondary | ICD-10-CM | POA: Insufficient documentation

## 2022-09-19 LAB — RESP PANEL BY RT-PCR (RSV, FLU A&B, COVID)  RVPGX2
Influenza A by PCR: NEGATIVE
Influenza B by PCR: NEGATIVE
Resp Syncytial Virus by PCR: NEGATIVE
SARS Coronavirus 2 by RT PCR: NEGATIVE

## 2022-09-19 MED ORDER — METHYLPREDNISOLONE 4 MG PO TBPK: ORAL_TABLET | ORAL | 0 refills | Status: AC

## 2022-09-19 MED ORDER — DEXAMETHASONE SODIUM PHOSPHATE 10 MG/ML IJ SOLN
10.0000 mg | Freq: Once | INTRAMUSCULAR | Status: AC
  Administered 2022-09-19: 10 mg via INTRAMUSCULAR
  Filled 2022-09-19: qty 1

## 2022-09-19 MED ORDER — KETOROLAC TROMETHAMINE 60 MG/2ML IM SOLN
60.0000 mg | Freq: Once | INTRAMUSCULAR | Status: AC
  Administered 2022-09-19: 60 mg via INTRAMUSCULAR
  Filled 2022-09-19: qty 2

## 2022-09-19 MED ORDER — NAPROXEN 375 MG PO TABS: 375.0000 mg | ORAL_TABLET | Freq: Two times a day (BID) | ORAL | 0 refills | Status: DC

## 2022-09-19 NOTE — Discharge Instructions (Addendum)
If you have continued severe throbbing headache despite treatment with medications that I am giving you please get help right away at one of our emergency departments for further evaluation.  This includes confusion, severe light sensitivity, severe sound sensitivity, vomiting, lethargy, vision changes, neck stiffness or high fever.  As discussed there can be some interaction with your antiviral/anti-inflammatory and could potentially cause some injury to your kidney.  Medications and anti-inflammatory medications.  Make sure that you are staying very well higher hydrated.  Please take it for only 5 to 7 days.  Get help right away if you have any new or concerning symptoms.  Get help right away if: You have shortness of breath that gets worse. You have severe or persistent: Headache. Ear pain. Sinus pain. Chest pain. You have chronic lung disease along with any of the following: Making high-pitched whistling sounds when you breathe, most often when you breathe out (wheezing). Prolonged cough (more than 14 days). Coughing up blood. A change in your usual mucus. You have a stiff neck. You have changes in your: Vision. Hearing. Thinking. Mood. These symptoms may be an emergency. Get help right away. Call 911. Do not wait to see if the symptoms will go away. Do not drive yourself to the hospital.

## 2022-09-19 NOTE — ED Provider Notes (Signed)
[place] [facility] Provider Note   CSN: [id number] Arrival date & time: 09/19/22  1914     History  Chief Complaint  Patient presents with   Flu like Symptoms    Craig Bishop is a 37 y.o. male with a past medical history of HIV who is on antiviral medications.  He presents emergency department with 3 days of flulike symptoms this includes chills, joint aches, body aches, throbbing headache which is relieved with over-the-counter cold and flu medications.  Patient has also had a cough, nasal congestion.  He denies neck stiffness, fever, photophobia, phonophobia, rash.  HPI     Home Medications Prior to Admission medications   Medication Sig Start Date End Date Taking? Authorizing Provider  dapsone 100 MG tablet TAKE ONE TABLET BY MOUTH DAILY 08/10/17   [provider]  Darun-Cobic-Emtricit-TenofAF (SYMTUZA PO) Take by mouth.    [provider]  Darunavir-Cobicisctat-Emtricitabine-Tenofovir Alafenamide (SYMTUZA) 800-150-200-10 MG TABS TAKE ONE TABLET BY MOUTH DAILY WITH FOOD 08/10/17   [provider]  diclofenac (VOLTAREN) 75 MG EC tablet Take 1 tablet (75 mg total) by mouth 2 (two) times daily. 07/28/18   Hudnall, Azucena Fallen, MD  hydrOXYzine (ATARAX/VISTARIL) 25 MG tablet Take 1-2 tablets (25-50 mg total) by mouth every 6 (six) hours as needed for itching. 04/16/20   Alvira Monday, MD  ondansetron (ZOFRAN-ODT) 4 MG disintegrating tablet Take 1 tablet (4 mg total) by mouth every 8 (eight) hours as needed for nausea or vomiting. 07/07/22   Pollyann Savoy, MD  tiZANidine (ZANAFLEX) 4 MG tablet Take 1 tablet (4 mg total) by mouth every 8 (eight) hours as needed. 09/21/17   Hudnall, Azucena Fallen, MD  tobramycin (TOBREX) 0.3 % ophthalmic solution Place 1 drop into the left eye 4 (four) times daily. 08/09/18   Maxwell Caul, PA-C  Rash  Allergies    Hydrocodone, Codeine, Oxycodone, and Sulfamethoxazole-trimethoprim    Review  of Systems   Review of Systems  Physical Exam Updated Vital Signs BP (!) 147/88 (BP Location: Right Arm)   Pulse 93   Temp 98.1 F (36.7 C)   Resp 18   Ht 6\' 3"  (1.905 m)   Wt 77.2 kg   SpO2 100%   BMI 21.27 kg/m  Physical Exam Physical Exam  Constitutional: Pt is oriented to person, place, and time. Pt appears well-developed and well-nourished. No distress.  HENT:  Head: Normocephalic and atraumatic.  Mouth/Throat: Oropharynx is mildly erythematous with cobblestoning Eyes: Conjunctivae and EOM are normal. Pupils are equal, round, and reactive to light. No scleral icterus.  Ears: Lateral external ears normal, no mastoid tenderness, no canal abnormalities, TMs normal bilaterally No horizontal, vertical or rotational nystagmus  Neck: Normal range of motion. Neck supple.  Full active and passive ROM without pain No midline or paraspinal tenderness No nuchal rigidity or meningeal signs  Cardiovascular: Normal rate, regular rhythm and intact distal pulses.   Pulmonary/Chest: Effort normal and breath sounds normal. No respiratory distress. Pt has no wheezes. No rales.  Abdominal: Soft. Bowel sounds are normal. There is no tenderness. There is no rebound and no guarding.  Musculoskeletal: Normal range of motion.  Lymphadenopathy:    No cervical adenopathy.  Neurological: Pt. is alert and oriented to person, place, and time. He has normal reflexes. No cranial nerve deficit.  Exhibits normal muscle tone. Coordination normal.  Mental Status:  Alert, oriented, thought content appropriate. Speech fluent without evidence of aphasia. Able to follow 2  step commands without difficulty.  Cranial Nerves:  II:  Peripheral visual fields grossly normal, pupils equal, round, reactive to light III,IV, VI: ptosis not present, extra-ocular motions intact bilaterally  V,VII: smile symmetric, facial light touch sensation equal VIII: hearing grossly normal bilaterally  IX,X: midline uvula rise  XI:  bilateral shoulder shrug equal and strong XII: midline tongue extension  Motor:  5/5 in upper and lower extremities bilaterally including strong and equal grip strength and dorsiflexion/plantar flexion Sensory: Pinprick and light touch normal in all extremities.  Deep Tendon Reflexes: 2+ and symmetric  Cerebellar: normal finger-to-nose with bilateral upper extremities Gait: normal gait and balance CV: distal pulses palpable throughout   Skin: Skin is warm and dry. No rash noted. Pt is not diaphoretic.  Psychiatric: Pt has a normal mood and affect. Behavior is normal. Judgment and thought content normal.  Nursing note and vitals reviewed.  ED Results / Procedures / Treatments   Labs (all labs ordered are listed, but only abnormal results are displayed) Labs Reviewed  RESP PANEL BY RT-PCR (RSV, FLU A&B, COVID)  RVPGX2    EKG None  Radiology No results found.  Procedures Procedures    Medications Ordered in ED Medications - No data to display  ED Course/ Medical Decision Making/ A&P                                 Medical Decision Making Risk Prescription drug management.   37 year old male history of HIV here with flulike symptoms and body ache.  He is taking all of his antiviral medications as prescribed.  He has a normal CD4 count per review of labs.  Patient states that he has been around a lot of people who have had URI symptoms.  He has been having similar symptoms.  He has a headache but does not have any evidence of meningitis,, no and evidence of encephalopathy. No evidence of mastoiditis.  Will treat symptomatically for viral infection.  Patient given shot of Toradol and Decadron for his headache here.  Will discharge with naproxen and Medrol.  Discussed that there could be some interaction that affects his kidneys.  Patient states he is already been taking 800 mg ibuprofen.  He has no have evidence of history of any kidney injury.  Discussed fluid intake and strict  return precautions including unrelieved headache despite medications, confusion, neck stiffness, fevers, intractable vomiting.        Final Clinical Impression(s) / ED Diagnoses Final diagnoses:  None    Rx / DC Orders ED Discharge Orders     None         Arthor Captain, PA-C 09/19/22 2106    Terald Sleeper, MD 09/19/22 2137

## 2022-09-19 NOTE — ED Triage Notes (Signed)
Pt states headache x 2 days, runny nose and chills as well

## 2023-01-21 ENCOUNTER — Encounter (HOSPITAL_BASED_OUTPATIENT_CLINIC_OR_DEPARTMENT_OTHER): Payer: Self-pay | Admitting: Emergency Medicine

## 2023-01-21 ENCOUNTER — Other Ambulatory Visit: Payer: Self-pay

## 2023-01-21 ENCOUNTER — Emergency Department (HOSPITAL_BASED_OUTPATIENT_CLINIC_OR_DEPARTMENT_OTHER)
Admission: EM | Admit: 2023-01-21 | Discharge: 2023-01-21 | Disposition: A | Discharge status: Home / Self Care | Payer: Self-pay | Attending: Emergency Medicine | Admitting: Emergency Medicine

## 2023-01-21 DIAGNOSIS — J069 Acute upper respiratory infection, unspecified: Secondary | ICD-10-CM | POA: Insufficient documentation

## 2023-01-21 DIAGNOSIS — Z21 Asymptomatic human immunodeficiency virus [HIV] infection status: Secondary | ICD-10-CM | POA: Insufficient documentation

## 2023-01-21 DIAGNOSIS — Z20822 Contact with and (suspected) exposure to covid-19: Secondary | ICD-10-CM | POA: Insufficient documentation

## 2023-01-21 LAB — RESP PANEL BY RT-PCR (RSV, FLU A&B, COVID)  RVPGX2
Influenza A by PCR: NEGATIVE
Influenza B by PCR: NEGATIVE
Resp Syncytial Virus by PCR: NEGATIVE
SARS Coronavirus 2 by RT PCR: NEGATIVE

## 2023-01-21 LAB — GROUP A STREP BY PCR: Group A Strep by PCR: NOT DETECTED

## 2023-01-21 MED ORDER — PREDNISONE 50 MG PO TABS: ORAL_TABLET | ORAL | 0 refills | Status: AC

## 2023-01-21 MED ORDER — BENZONATATE 100 MG PO CAPS: 100.0000 mg | ORAL_CAPSULE | Freq: Three times a day (TID) | ORAL | 0 refills | Status: AC

## 2023-01-21 NOTE — ED Triage Notes (Signed)
Pt states he has a HA, sore throat, cough & runny nose x 3d

## 2023-01-21 NOTE — Discharge Instructions (Addendum)
You were evaluated in the emergency room for cough, headache, sore throat.  Your respiratory panel and strep test were negative.  Prescription for prednisone was sent into your pharmacy.  Please be sure to complete the full course, additionally prescription for Tessalon Perles, cough medication was sent into your pharmacy please use this as needed.  You were provided resources to establish with a primary care doctor.  Please call make an appointment within the next 5 days to ensure your symptoms are improving.  If you experience any new or worsening symptoms including worsening cough, persistent fever, shortness of breath please return to the emergency room.

## 2023-01-21 NOTE — ED Provider Notes (Signed)
[place] [facility] Provider Note   CSN: [id number] Arrival date & time: 01/21/23  1836     History  Chief Complaint  Patient presents with   Cough    Craig Bishop is a 38 y.o. male with HIV who presents with headache, sore throat, nonproductive cough and nasal congestion x 3 days.  Patient reports symptoms have not improved despite taking Mucinex.  Reports history of migraines.  This headache feels similar and has been intermittent.  Not sudden or maximal in onset.  No fevers or chills, no vomiting or diarrhea.   Cough      Home Medications Prior to Admission medications   Medication Sig Start Date End Date Taking? Authorizing Provider  benzonatate (TESSALON) 100 MG capsule Take 1 capsule (100 mg total) by mouth every 8 (eight) hours. 01/21/23  Yes Halford Decamp, PA-C  predniSONE (DELTASONE) 50 MG tablet Take 1 tablet by mouth every day at breakfast 01/21/23  Yes Halford Decamp, PA-C  dapsone 100 MG tablet TAKE ONE TABLET BY MOUTH DAILY 08/10/17   [provider]  Darun-Cobic-Emtricit-TenofAF (SYMTUZA PO) Take by mouth.    [provider]  Darunavir-Cobicisctat-Emtricitabine-Tenofovir Alafenamide (SYMTUZA) 800-150-200-10 MG TABS TAKE ONE TABLET BY MOUTH DAILY WITH FOOD 08/10/17   [provider]  diclofenac (VOLTAREN) 75 MG EC tablet Take 1 tablet (75 mg total) by mouth 2 (two) times daily. 07/28/18   Hudnall, Azucena Fallen, MD  hydrOXYzine (ATARAX/VISTARIL) 25 MG tablet Take 1-2 tablets (25-50 mg total) by mouth every 6 (six) hours as needed for itching. 04/16/20   Alvira Monday, MD  methylPREDNISolone (MEDROL DOSEPAK) 4 MG TBPK tablet Use as directed 09/19/22   Arthor Captain, PA-C  naproxen (NAPROSYN) 375 MG tablet Take 1 tablet (375 mg total) by mouth 2 (two) times daily with a meal. 09/19/22   Harris, Abigail, PA-C  ondansetron (ZOFRAN-ODT) 4 MG disintegrating tablet Take 1 tablet (4 mg total) by mouth every 8  (eight) hours as needed for nausea or vomiting. 07/07/22   Pollyann Savoy, MD  tiZANidine (ZANAFLEX) 4 MG tablet Take 1 tablet (4 mg total) by mouth every 8 (eight) hours as needed. 09/21/17   Hudnall, Azucena Fallen, MD  tobramycin (TOBREX) 0.3 % ophthalmic solution Place 1 drop into the left eye 4 (four) times daily. 08/09/18   Maxwell Caul, PA-C      Allergies    Hydrocodone, Codeine, Oxycodone, and Sulfamethoxazole-trimethoprim    Review of Systems   Review of Systems  Respiratory:  Positive for cough.     Physical Exam Updated Vital Signs BP 134/89 (BP Location: Right Arm)   Pulse (!) 108   Temp 99 F (37.2 C)   Resp (!) 24   Ht 6\' 2"  (1.88 m)   Wt 77.1 kg   SpO2 92%   BMI 21.83 kg/m  Physical Exam Vitals and nursing note reviewed.  Constitutional:      General: He is not in acute distress.    Appearance: He is well-developed.  HENT:     Head: Normocephalic and atraumatic.  Eyes:     Conjunctiva/sclera: Conjunctivae normal.  Cardiovascular:     Rate and Rhythm: Normal rate and regular rhythm.     Heart sounds: No murmur heard. Pulmonary:     Effort: Pulmonary effort is normal. No respiratory distress.     Comments: Very faint intermittent rhonchi throughout, no increased respiratory effort Abdominal:     Palpations: Abdomen is soft.  Tenderness: There is no abdominal tenderness.  Musculoskeletal:        General: No swelling.     Cervical back: Normal range of motion and neck supple. No rigidity.  Skin:    General: Skin is warm and dry.     Capillary Refill: Capillary refill takes less than 2 seconds.  Neurological:     General: No focal deficit present.     Mental Status: He is alert and oriented to person, place, and time.     Cranial Nerves: No cranial nerve deficit.     Sensory: No sensory deficit.     Motor: No weakness.     Coordination: Coordination normal.     Gait: Gait normal.  Psychiatric:        Mood and Affect: Mood normal.     ED Results  / Procedures / Treatments   Labs (all labs ordered are listed, but only abnormal results are displayed) Labs Reviewed  GROUP A STREP BY PCR  RESP PANEL BY RT-PCR (RSV, FLU A&B, COVID)  RVPGX2    EKG None  Radiology No results found.  Procedures Procedures    Medications Ordered in ED Medications - No data to display  ED Course/ Medical Decision Making/ A&P                                 Medical Decision Making  This patient presents to the ED with chief complaint(s) of URI symptoms.  The complaint involves an extensive differential diagnosis and also carries with it a high risk of complications and morbidity.   pertinent past medical history as listed in HPI  The differential diagnosis includes  Viral URI, pneumonia, strep throat, AOM, sinusitis, meningitis The initial plan is to  Will obtain respiratory panel and strep test Additional history obtained: Records reviewed previous admission documents  Initial Assessment:   Hemodynamically stable, afebrile, nontoxic-appearing patient presenting with headache and URI symptoms x 3 days.  No recent sick contacts.  Lung sounds with very mild intermittent rhonchi, no rales.  Headache is not sudden or maximal in onset.  No meningeal signs.  Neuroexam without focal findings.  Overall suspect viral URI.  Independent ECG interpretation:  none  Independent labs interpretation:  The following labs were independently interpreted:  Respiratory and strep negative  Independent visualization and interpretation of imaging: none  Treatment and Reassessment: Offered patient something for his headache including Toradol and Decadron, however he declines.  States he just wants to go home.  Consultations obtained:   none  Disposition:   Patient discharged home with short course of prednisone and Tessalon Perles.  Provided resources to establish with primary care. The patient has been appropriately medically screened and/or stabilized  in the ED. I have low suspicion for any other emergent medical condition which would require further screening, evaluation or treatment in the ED or require inpatient management. At time of discharge the patient is hemodynamically stable and in no acute distress. I have discussed work-up results and diagnosis with patient and answered all questions. Patient is agreeable with discharge plan. We discussed strict return precautions for returning to the emergency department and they verbalized understanding.     Social Determinants of Health:   Patient's impaired access to primary care  increases the complexity of managing their presentation  This note was dictated with voice recognition software.  Despite best efforts at proofreading, errors may have occurred which can change  the documentation meaning.          Final Clinical Impression(s) / ED Diagnoses Final diagnoses:  Viral upper respiratory tract infection    Rx / DC Orders ED Discharge Orders          Ordered    benzonatate (TESSALON) 100 MG capsule  Every 8 hours        01/21/23 2312    predniSONE (DELTASONE) 50 MG tablet        01/21/23 2313              Halford Decamp, PA-C 01/21/23 2317    Tegeler, Canary Brim, MD 01/21/23 [phone]

## 2023-02-11 ENCOUNTER — Other Ambulatory Visit: Payer: Self-pay

## 2023-02-11 ENCOUNTER — Encounter (HOSPITAL_BASED_OUTPATIENT_CLINIC_OR_DEPARTMENT_OTHER): Payer: Self-pay | Admitting: Emergency Medicine

## 2023-02-11 ENCOUNTER — Emergency Department (HOSPITAL_BASED_OUTPATIENT_CLINIC_OR_DEPARTMENT_OTHER)
Admission: EM | Admit: 2023-02-11 | Discharge: 2023-02-12 | Disposition: A | Discharge status: Home / Self Care | Payer: Self-pay | Attending: Emergency Medicine | Admitting: Emergency Medicine

## 2023-02-11 DIAGNOSIS — R519 Headache, unspecified: Secondary | ICD-10-CM | POA: Insufficient documentation

## 2023-02-11 DIAGNOSIS — Z21 Asymptomatic human immunodeficiency virus [HIV] infection status: Secondary | ICD-10-CM | POA: Insufficient documentation

## 2023-02-11 DIAGNOSIS — S161XXA Strain of muscle, fascia and tendon at neck level, initial encounter: Secondary | ICD-10-CM | POA: Insufficient documentation

## 2023-02-11 DIAGNOSIS — Y92410 Unspecified street and highway as the place of occurrence of the external cause: Secondary | ICD-10-CM | POA: Diagnosis not present

## 2023-02-11 DIAGNOSIS — S39012A Strain of muscle, fascia and tendon of lower back, initial encounter: Secondary | ICD-10-CM | POA: Insufficient documentation

## 2023-02-11 DIAGNOSIS — M542 Cervicalgia: Secondary | ICD-10-CM | POA: Diagnosis present
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
## 2023-02-12 ENCOUNTER — Emergency Department (HOSPITAL_BASED_OUTPATIENT_CLINIC_OR_DEPARTMENT_OTHER): Payer: No Typology Code available for payment source

## 2023-02-12 MED ORDER — METHOCARBAMOL 500 MG PO TABS: 500.0000 mg | ORAL_TABLET | Freq: Three times a day (TID) | ORAL | 0 refills | Status: AC | PRN

## 2023-02-12 MED ORDER — IBUPROFEN 400 MG PO TABS
600.0000 mg | ORAL_TABLET | Freq: Once | ORAL | Status: AC
  Administered 2023-02-12: 600 mg via ORAL
  Filled 2023-02-12: qty 1

## 2023-02-12 MED ORDER — IBUPROFEN 600 MG PO TABS: 600.0000 mg | ORAL_TABLET | Freq: Four times a day (QID) | ORAL | 0 refills | Status: AC | PRN
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
## 2023-03-18 ENCOUNTER — Encounter (HOSPITAL_BASED_OUTPATIENT_CLINIC_OR_DEPARTMENT_OTHER): Payer: Self-pay | Admitting: Emergency Medicine

## 2023-03-18 ENCOUNTER — Other Ambulatory Visit: Payer: Self-pay

## 2023-03-18 DIAGNOSIS — R0602 Shortness of breath: Secondary | ICD-10-CM | POA: Insufficient documentation

## 2023-03-18 DIAGNOSIS — Z20822 Contact with and (suspected) exposure to covid-19: Secondary | ICD-10-CM | POA: Insufficient documentation

## 2023-03-18 DIAGNOSIS — M791 Myalgia, unspecified site: Secondary | ICD-10-CM | POA: Insufficient documentation

## 2023-03-18 DIAGNOSIS — B20 Human immunodeficiency virus [HIV] disease: Secondary | ICD-10-CM | POA: Insufficient documentation

## 2023-03-18 DIAGNOSIS — R059 Cough, unspecified: Secondary | ICD-10-CM | POA: Insufficient documentation

## 2023-03-18 DIAGNOSIS — R6883 Chills (without fever): Secondary | ICD-10-CM | POA: Insufficient documentation

## 2023-03-18 LAB — RESP PANEL BY RT-PCR (RSV, FLU A&B, COVID)  RVPGX2
Influenza A by PCR: NEGATIVE
Influenza B by PCR: NEGATIVE
Resp Syncytial Virus by PCR: NEGATIVE
SARS Coronavirus 2 by RT PCR: NEGATIVE

## 2023-03-18 NOTE — ED Triage Notes (Signed)
 Chills, generalized body aches, cough, headache, decreased appetite, and intermittent sweats x 4 days. Possible known sick exposure at work.

## 2023-03-19 ENCOUNTER — Emergency Department (HOSPITAL_BASED_OUTPATIENT_CLINIC_OR_DEPARTMENT_OTHER): Payer: Self-pay

## 2023-03-19 ENCOUNTER — Emergency Department (HOSPITAL_BASED_OUTPATIENT_CLINIC_OR_DEPARTMENT_OTHER)
Admission: EM | Admit: 2023-03-19 | Discharge: 2023-03-19 | Disposition: A | Discharge status: Home / Self Care | Payer: Self-pay | Attending: Emergency Medicine | Admitting: Emergency Medicine

## 2023-03-19 DIAGNOSIS — Z21 Asymptomatic human immunodeficiency virus [HIV] infection status: Secondary | ICD-10-CM

## 2023-03-19 DIAGNOSIS — J111 Influenza due to unidentified influenza virus with other respiratory manifestations: Secondary | ICD-10-CM

## 2023-03-19 MED ORDER — IPRATROPIUM-ALBUTEROL 0.5-2.5 (3) MG/3ML IN SOLN
3.0000 mL | Freq: Once | RESPIRATORY_TRACT | Status: DC
  Filled 2023-03-19: qty 3

## 2023-03-19 NOTE — ED Provider Notes (Signed)
 [facility] Provider Note   CSN: [id number] Arrival date & time: 03/18/23  1950     History  Chief Complaint  Patient presents with   Chills    Craig Bishop is a 38 y.o. male.  The history is provided by the patient.  He has history of HIV infection, ulcerative colitis and comes in because of cough, chills, body aches for the last 4 days.  He denies fever but has had cough productive of clear to yellow sputum.  He also endorses sweats.  He has had sick contacts.  He has felt slightly short of breath.  He is not sure what is CD4 count status is.   Home Medications Prior to Admission medications   Medication Sig Start Date End Date Taking? Authorizing Provider  benzonatate (TESSALON) 100 MG capsule Take 1 capsule (100 mg total) by mouth every 8 (eight) hours. 01/21/23   Halford Decamp, PA-C  dapsone 100 MG tablet TAKE ONE TABLET BY MOUTH DAILY 08/10/17   [provider]  Darun-Cobic-Emtricit-TenofAF (SYMTUZA PO) Take by mouth.    [provider]  Darunavir-Cobicisctat-Emtricitabine-Tenofovir Alafenamide (SYMTUZA) 800-150-200-10 MG TABS TAKE ONE TABLET BY MOUTH DAILY WITH FOOD 08/10/17   [provider]  hydrOXYzine (ATARAX/VISTARIL) 25 MG tablet Take 1-2 tablets (25-50 mg total) by mouth every 6 (six) hours as needed for itching. 04/16/20   Alvira Monday, MD  ibuprofen (ADVIL) 600 MG tablet Take 1 tablet (600 mg total) by mouth every 6 (six) hours as needed. 02/12/23   Tilden Fossa, MD  methocarbamol (ROBAXIN) 500 MG tablet Take 1 tablet (500 mg total) by mouth every 8 (eight) hours as needed for muscle spasms. 02/12/23   Tilden Fossa, MD  methylPREDNISolone (MEDROL DOSEPAK) 4 MG TBPK tablet Use as directed 09/19/22   Arthor Captain, PA-C  ondansetron (ZOFRAN-ODT) 4 MG disintegrating tablet Take 1 tablet (4 mg total) by mouth every 8 (eight) hours as needed for nausea or vomiting. 07/07/22   Pollyann Savoy,  MD  predniSONE (DELTASONE) 50 MG tablet Take 1 tablet by mouth every day at breakfast 01/21/23   Halford Decamp, PA-C  tobramycin (TOBREX) 0.3 % ophthalmic solution Place 1 drop into the left eye 4 (four) times daily. 08/09/18   Maxwell Caul, PA-C      Allergies    Hydrocodone, Codeine, Oxycodone, and Sulfamethoxazole-trimethoprim    Review of Systems   Review of Systems  All other systems reviewed and are negative.   Physical Exam Updated Vital Signs BP (!) 134/91   Pulse 75   Temp 98.7 F (37.1 C)   Resp 18   Ht 6\' 3"  (1.905 m)   Wt 77.1 kg   SpO2 99%   BMI 21.25 kg/m  Physical Exam Vitals and nursing note reviewed.   38 year old male, resting comfortably and in no acute distress. Vital signs are significant for borderline elevated blood pressure. Oxygen saturation is 99%, which is normal. Head is normocephalic and atraumatic. PERRLA, EOMI. Oropharynx is clear. Neck is nontender and supple without adenopathy Lungs have a slightly prolonged exhalation phase with slight wheezing noted with forced exhalation.  There are no rales or rhonchi. Chest is nontender. Heart has regular rate and rhythm without murmur. Abdomen is soft, flat, nontender. Extremities have no cyanosis or edema, full range of motion is present. Skin is warm and dry without rash. Neurologic: Mental status is normal, cranial nerves are intact, moves all extremities equally.  ED  Results / Procedures / Treatments   Labs (all labs ordered are listed, but only abnormal results are displayed) Labs Reviewed  RESP PANEL BY RT-PCR (RSV, FLU A&B, COVID)  RVPGX2   Radiology DG Chest 2 View Result Date: 03/19/2023 CLINICAL DATA:  Cough, chills, body aches EXAM: CHEST - 2 VIEW COMPARISON:  None Available. FINDINGS: The heart size and mediastinal contours are within normal limits. Both lungs are clear. The visualized skeletal structures are unremarkable. IMPRESSION: No active cardiopulmonary disease.  Electronically Signed   By: Minerva Fester M.D.   On: 03/19/2023 03:30    Procedures Procedures    Medications Ordered in ED Medications - No data to display  ED Course/ Medical Decision Making/ A&P                                 Medical Decision Making Amount and/or Complexity of Data Reviewed Radiology: ordered.  Risk Prescription drug management.   Influenza-like illness in patient with HIV disease.  Need to rule out pneumonia.  I have reviewed his laboratory test, my interpretation is negative PCR for influenza, COVID-19, RSV.  I have reviewed his past records, and his last office visit on 11/22/2018 for mentioned medication noncompliance and recommended return visit in 6 weeks which she apparently had missed.  Laboratory work on that day showed HIV-1 RNA 159,000, present helper lymphocytes less than 1%.  I have ordered a chest x-ray.  I have ordered a nebulizer treatment with albuterol and ipratropium.  Chest x-ray shows no evidence of pneumonia.  I have independently viewed the images, and agree with the radiologist's interpretation.  Patient is refusing nebulizer treatment.  I am discharging him with instructions to follow-up with his infectious disease physician.  He does state that he has been compliant with his HIV medication.  Final Clinical Impression(s) / ED Diagnoses Final diagnoses:  Influenza-like illness  HIV infection, unspecified symptom status (HCC)    Rx / DC Orders ED Discharge Orders     None         Dione Booze, MD 03/19/23 [phone]

## 2023-03-19 NOTE — Discharge Instructions (Addendum)
 Drink plenty of fluids.  Take acetaminophen and/or ibuprofen as needed for fever or aching.  It is very important that you follow-up with the infectious disease clinic to monitor your HIV disease.  Is also very important that you make sure you take your HIV medication every day.

## 2023-06-09 ENCOUNTER — Encounter (HOSPITAL_BASED_OUTPATIENT_CLINIC_OR_DEPARTMENT_OTHER): Payer: Self-pay | Admitting: Emergency Medicine

## 2023-06-09 ENCOUNTER — Other Ambulatory Visit: Payer: Self-pay

## 2023-06-09 ENCOUNTER — Emergency Department (HOSPITAL_BASED_OUTPATIENT_CLINIC_OR_DEPARTMENT_OTHER)
Admission: EM | Admit: 2023-06-09 | Discharge: 2023-06-09 | Disposition: A | Discharge status: Home / Self Care | Payer: Self-pay | Attending: Emergency Medicine | Admitting: Emergency Medicine

## 2023-06-09 DIAGNOSIS — R519 Headache, unspecified: Secondary | ICD-10-CM | POA: Insufficient documentation

## 2023-06-09 DIAGNOSIS — M791 Myalgia, unspecified site: Secondary | ICD-10-CM | POA: Insufficient documentation

## 2023-06-09 DIAGNOSIS — Z21 Asymptomatic human immunodeficiency virus [HIV] infection status: Secondary | ICD-10-CM | POA: Insufficient documentation

## 2023-06-09 DIAGNOSIS — R6889 Other general symptoms and signs: Secondary | ICD-10-CM

## 2023-06-09 LAB — RESP PANEL BY RT-PCR (RSV, FLU A&B, COVID)  RVPGX2
Influenza A by PCR: NEGATIVE
Influenza B by PCR: NEGATIVE
Resp Syncytial Virus by PCR: NEGATIVE
SARS Coronavirus 2 by RT PCR: NEGATIVE

## 2023-06-09 MED ORDER — BUTALBITAL-APAP-CAFFEINE 50-325-40 MG PO TABS: 1.0000 | ORAL_TABLET | Freq: Once | ORAL | Status: DC

## 2023-06-09 MED ORDER — IBUPROFEN 200 MG PO TABS
600.0000 mg | ORAL_TABLET | Freq: Once | ORAL | Status: AC
  Administered 2023-06-09: 600 mg via ORAL
  Filled 2023-06-09: qty 1

## 2023-06-09 MED ORDER — ASPIRIN-ACETAMINOPHEN-CAFFEINE 250-250-65 MG PO TABS
1.0000 | ORAL_TABLET | Freq: Once | ORAL | Status: AC
  Administered 2023-06-09: 1 via ORAL
  Filled 2023-06-09: qty 1

## 2023-06-09 NOTE — Discharge Instructions (Signed)
 You were evaluated in the emergency room for flulike symptoms.your respiratory panel was negative.  You may use Tylenol  1000 mg and/or Motrin  600 mg every 4-6 hours up to 3 times a day for fever or body aches.  Please keep in mind that this dosing is not meant to be continued long-term and that many over-the-counter cough and flu medications contain acetaminophen  or ibuprofen . You can expect your current symptoms to linger over the next week or two but please return to the emergency room if you experience any new or worsening symptoms including persistent fevers, worsening productive cough and persistent vomiting. Please follow-up with your primary care provider regarding your ER visit.

## 2023-06-09 NOTE — ED Provider Notes (Addendum)
 [facility] Provider Note   CSN: [id number] Arrival date & time: 06/09/23  2034     History  Chief Complaint  Patient presents with   Headache   Generalized Body Aches    Craig Bishop is a 38 y.o. male with hx of HIV and ulcerative colitis presents with complaints of headache, myalgias, and nausea.  Denies any cough, but states she does feel nasal congestion.  No abdominal pain or diarrhea.  He is not sure what his CD4 counts are.  He does recent exposure to influenza..  States he woke up Saturday with a headache.  Feels like he is getting better, but he would just like something for the headache.  Notes that he typically gets migraines.  No associated vision changes, dizziness or difficulty ambulating.   Headache  Past Medical History:  Diagnosis Date   HIV positive (HCC)    Ulcerative colitis (HCC)        Home Medications Prior to Admission medications   Medication Sig Start Date End Date Taking? Authorizing Provider  benzonatate  (TESSALON ) 100 MG capsule Take 1 capsule (100 mg total) by mouth every 8 (eight) hours. 01/21/23   Felicie Horning, PA-C  dapsone 100 MG tablet TAKE ONE TABLET BY MOUTH DAILY 08/10/17   [provider]  Darun-Cobic-Emtricit-TenofAF (SYMTUZA PO) Take by mouth.    [provider]  Darunavir-Cobicisctat-Emtricitabine-Tenofovir Alafenamide (SYMTUZA) 800-150-200-10 MG TABS TAKE ONE TABLET BY MOUTH DAILY WITH FOOD 08/10/17   [provider]  hydrOXYzine  (ATARAX /VISTARIL ) 25 MG tablet Take 1-2 tablets (25-50 mg total) by mouth every 6 (six) hours as needed for itching. 04/16/20   Scarlette Currier, MD  ibuprofen  (ADVIL ) 600 MG tablet Take 1 tablet (600 mg total) by mouth every 6 (six) hours as needed. 02/12/23   Kelsey Patricia, MD  methocarbamol  (ROBAXIN ) 500 MG tablet Take 1 tablet (500 mg total) by mouth every 8 (eight) hours as needed for muscle spasms. 02/12/23   Kelsey Patricia, MD   methylPREDNISolone  (MEDROL  DOSEPAK) 4 MG TBPK tablet Use as directed 09/19/22   Harris, Abigail, PA-C  ondansetron  (ZOFRAN -ODT) 4 MG disintegrating tablet Take 1 tablet (4 mg total) by mouth every 8 (eight) hours as needed for nausea or vomiting. 07/07/22   Charmayne Cooper, MD  predniSONE  (DELTASONE ) 50 MG tablet Take 1 tablet by mouth every day at breakfast 01/21/23   Felicie Horning, PA-C  tobramycin  (TOBREX ) 0.3 % ophthalmic solution Place 1 drop into the left eye 4 (four) times daily. 08/09/18   Layden, Lindsey A, PA-C      Allergies    Hydrocodone, Codeine, Oxycodone, and Sulfamethoxazole-trimethoprim    Review of Systems   Review of Systems  Neurological:  Positive for headaches.    Physical Exam Updated Vital Signs BP 128/84 (BP Location: Right Arm)   Pulse 90   Temp 98.4 F (36.9 C)   Resp 20   Ht 6\' 3"  (1.905 m)   Wt 77.1 kg   SpO2 98%   BMI 21.25 kg/m  Physical Exam Vitals and nursing note reviewed.  Constitutional:      General: He is not in acute distress.    Appearance: He is well-developed.  HENT:     Head: Normocephalic and atraumatic.     Nose: Congestion present.     Comments: No sinus tenderness Eyes:     Conjunctiva/sclera: Conjunctivae normal.  Cardiovascular:     Rate and Rhythm: Normal rate and regular rhythm.  Heart sounds: No murmur heard. Pulmonary:     Effort: Pulmonary effort is normal. No respiratory distress.     Breath sounds: Normal breath sounds.  Abdominal:     Palpations: Abdomen is soft.     Tenderness: There is no abdominal tenderness.  Musculoskeletal:        General: No swelling.     Cervical back: Neck supple.  Skin:    General: Skin is warm and dry.     Capillary Refill: Capillary refill takes less than 2 seconds.  Neurological:     Mental Status: He is alert.  Psychiatric:        Mood and Affect: Mood normal.     ED Results / Procedures / Treatments   Labs (all labs ordered are listed, but only abnormal results  are displayed) Labs Reviewed  RESP PANEL BY RT-PCR (RSV, FLU A&B, COVID)  RVPGX2    EKG None  Radiology No results found.  Procedures Procedures    Medications Ordered in ED Medications  butalbital-acetaminophen -caffeine (FIORICET) 50-325-40 MG per tablet 1 tablet (has no administration in time range)  ibuprofen  (ADVIL ) tablet 600 mg (has no administration in time range)    ED Course/ Medical Decision Making/ A&P                                 Medical Decision Making Risk Prescription drug management.   This patient presents to the ED with chief complaint(s) of headache, myalgias, congestion.  The complaint involves an extensive differential diagnosis and also carries with it a high risk of complications and morbidity.   pertinent past medical history as listed in HPI  The differential diagnosis includes  viral illness, pharyngitis, mono, sinusitis,AOM, pneumonia  The initial plan is to  Obtain respiratory panel Additional history obtained: Records reviewed Care Everywhere/External Records  Initial Assessment:   Nontoxic-appearing patient presenting with headache, nausea, congestion.  I have a low suspicion for pneumonia as patient has no cough, lung sounds are clear and patient is afebrile.  No exudate or significant cervical lymphadenopathy on exam to suggest strep or mono.  He has no sinus tenderness.  Headache was not sudden or maximal in onset, is not worst of life time.  Reports she typically gets migraines.  He has no neuro deficits on exam.  Overall history and exam are most suspicious for influenza-like illness.  Patient is hemodynamically stable and not requiring oxygen.  Anticipate discharge home with supportive care.  Offered migraine cocktail.  He denies interested in IV.  Will give a dose of Fioricet and Motrin .  He is interested in discharge following medication.  Independent ECG interpretation:  None   Independent labs interpretation:  The following labs  were independently interpreted:    Independent visualization and interpretation of imaging: I independently visualized the following imaging with scope of interpretation limited to determining acute life threatening conditions related to emergency care: none  Consultations obtained:   None   Disposition:   Patient will be discharged home.  Educated on supportive care.  Encouraged to follow-up with primary care provider should her symptoms persist. The patient has been appropriately medically screened and/or stabilized in the ED. I have low suspicion for any other emergent medical condition which would require further screening, evaluation or treatment in the ED or require inpatient management. At time of discharge the patient is hemodynamically stable and in no acute distress. I have discussed work-up results  and diagnosis with patient and answered all questions. Patient is agreeable with discharge plan. We discussed strict return precautions for returning to the emergency department and they verbalized understanding.     Social Determinants of Health:   none  This note was dictated with voice recognition software.  Despite best efforts at proofreading, errors may have occurred which can change the documentation meaning.          Final Clinical Impression(s) / ED Diagnoses Final diagnoses:  Flu-like symptoms    Rx / DC Orders ED Discharge Orders     None         Felicie Horning, PA-C 06/09/23 2206    Felicie Horning, PA-C 06/09/23 2210    Sueellen Emery, MD 06/09/23 2245

## 2023-06-09 NOTE — ED Triage Notes (Signed)
 Pt c/o headache, body aches, nausea, general malaise since Saturday with recent exposure to flu.
